# Patient Record
Sex: Female | Born: 1970 | Hispanic: No | Marital: Single | Smoking: Never smoker
Health system: Southern US, Community
[De-identification: ages and names within clinical notes are randomized; demographics above are authoritative.]

## PROBLEM LIST (undated history)

## (undated) DIAGNOSIS — R51 Headache: Secondary | ICD-10-CM

## (undated) DIAGNOSIS — R0602 Shortness of breath: Secondary | ICD-10-CM

## (undated) DIAGNOSIS — M722 Plantar fascial fibromatosis: Secondary | ICD-10-CM

## (undated) DIAGNOSIS — M7730 Calcaneal spur, unspecified foot: Secondary | ICD-10-CM

## (undated) DIAGNOSIS — Z9889 Other specified postprocedural states: Secondary | ICD-10-CM

## (undated) DIAGNOSIS — F419 Anxiety disorder, unspecified: Secondary | ICD-10-CM

## (undated) DIAGNOSIS — K219 Gastro-esophageal reflux disease without esophagitis: Secondary | ICD-10-CM

## (undated) DIAGNOSIS — R112 Nausea with vomiting, unspecified: Secondary | ICD-10-CM

## (undated) DIAGNOSIS — K760 Fatty (change of) liver, not elsewhere classified: Secondary | ICD-10-CM

## (undated) HISTORY — PX: ENDOMETRIAL ABLATION: SHX621

## (undated) HISTORY — PX: TUBAL LIGATION: SHX77

## (undated) HISTORY — PX: DILATION AND CURETTAGE OF UTERUS: SHX78

## (undated) HISTORY — PX: CARPAL TUNNEL RELEASE: SHX101

---

## 2003-06-18 ENCOUNTER — Encounter: Admission: RE | Admit: 2003-06-18 | Discharge: 2003-06-18 | Payer: Self-pay | Admitting: Obstetrics and Gynecology

## 2003-06-24 ENCOUNTER — Ambulatory Visit (HOSPITAL_COMMUNITY): Admission: RE | Admit: 2003-06-24 | Discharge: 2003-06-24 | Payer: Self-pay | Admitting: Family Medicine

## 2003-07-09 ENCOUNTER — Encounter: Admission: RE | Admit: 2003-07-09 | Discharge: 2003-07-09 | Payer: Self-pay | Admitting: Obstetrics and Gynecology

## 2008-05-11 ENCOUNTER — Encounter: Admission: RE | Admit: 2008-05-11 | Discharge: 2008-05-11 | Payer: Self-pay | Admitting: Sports Medicine

## 2008-06-11 ENCOUNTER — Encounter: Admission: RE | Admit: 2008-06-11 | Discharge: 2008-06-11 | Payer: Self-pay | Admitting: Sports Medicine

## 2010-01-14 ENCOUNTER — Encounter: Admission: RE | Admit: 2010-01-14 | Discharge: 2010-01-14 | Payer: Self-pay | Admitting: Sports Medicine

## 2010-01-17 ENCOUNTER — Encounter: Admission: RE | Admit: 2010-01-17 | Discharge: 2010-01-17 | Payer: Self-pay

## 2010-06-21 ENCOUNTER — Encounter: Admission: RE | Admit: 2010-06-21 | Discharge: 2010-06-21 | Payer: Self-pay | Admitting: Internal Medicine

## 2010-06-28 ENCOUNTER — Ambulatory Visit: Payer: Self-pay | Admitting: Nurse Practitioner

## 2010-07-21 ENCOUNTER — Encounter: Admission: RE | Admit: 2010-07-21 | Discharge: 2010-07-21 | Payer: Self-pay | Admitting: Obstetrics and Gynecology

## 2011-02-21 ENCOUNTER — Encounter (INDEPENDENT_AMBULATORY_CARE_PROVIDER_SITE_OTHER): Payer: 59 | Admitting: Nurse Practitioner

## 2011-02-21 DIAGNOSIS — G43109 Migraine with aura, not intractable, without status migrainosus: Secondary | ICD-10-CM

## 2011-02-22 NOTE — Assessment & Plan Note (Unsigned)
Pollard, Julia NO.:  [id number]  MEDICAL RECORD NO.:  [id number]           PATIENT TYPE:  LOCATION:  [facility]           FACILITY:  PHYSICIAN:  [name], MD          DATE OF BIRTH:  DATE OF SERVICE:  02/21/2011                                 CLINIC NOTE  The patient comes to office today for followup on her migraine headaches.  The patient was last seen on August 2011.  At that time, she was placed on 100 mg of Topamax given Imitrex and asked to return in 6 weeks.  The patient has had some personal difficulties including filing for bankruptcy and has not kept that return appointment.  She has called in for prescriptions and has been given refills and has maintained her Topamax at 100 mg.  She was doing well with the Topamax and Imitrex until recently when she had a severe headache that lasted for 3-4 days. When we described that headache, it was clear that she did not have her Imitrex and had not taken her Imitrex.  She did call her another physician and got a prescription for Ultram and Thorazine.  Those seem to have helped a little bit but not altogether.  She has continued to see Dr. Ferdinand Cava for her bleeding issues.  She did have I believe an ablation at one time, but continues to have a menstrual cycle.  He currently has her on Loestrin but which she does do well with, but she is an insulin-dependent diabetic and has obesity and fatty liver.  He has recommended that she may want to consider hysterectomy.  In the meantime, she has gained 10 pounds in the past 1 year and her weight is now up to 228 and her height is 5 feet 3 inches.  PHYSICAL EXAM:  VITAL SIGNS:  Blood pressure is 120/76, pulse 81, weight 228, height is 5 feet 3 inches. NEUROLOGIC:  The patient is alert.  She is oriented.  She has good mood and affect.  She has good muscle tone.  Good sensation.  Good coordination. EXTREMITIES:  Warm and dry.  ASSESSMENT: 1.  Migraine. 2. Obesity.  PLAN:  We had a lengthy discussion today and discussed the pathophysiology of migraine and how the Imitrex actually works to reverse the process of a migraine.  The patient has understanding of this and will keep her Imitrex with her now and use it when she has a headache.  We also discussed going up on the Topamax to 200 mg.  She did not have side effects of 100 mg.  Hopefully, we will go up to 200 that will give her better headache prevention and possibly help her with this weight.  She is also given the name of Dr. Baruch Merl for bariatric surgery evaluation. The patient will also get a refill on her Anaprox.  The patient is also asking for referral to see Dr. Nicholaus Bloom for robotic hysterectomy.  The patient will return to this office in 1 year or sooner as need be.     Julia Richter, NP   ______________________________ [name], MD   LR/MEDQ  D:  02/21/2011  T:  02/21/2011  Job:  [id number]

## 2011-03-08 ENCOUNTER — Other Ambulatory Visit: Payer: Self-pay | Admitting: Obstetrics & Gynecology

## 2011-03-08 ENCOUNTER — Ambulatory Visit (HOSPITAL_COMMUNITY)
Admission: RE | Admit: 2011-03-08 | Discharge: 2011-03-08 | Disposition: A | Discharge status: Home / Self Care | Payer: 59 | Source: Ambulatory Visit | Attending: Obstetrics & Gynecology | Admitting: Obstetrics & Gynecology

## 2011-03-08 ENCOUNTER — Ambulatory Visit (INDEPENDENT_AMBULATORY_CARE_PROVIDER_SITE_OTHER): Payer: 59 | Admitting: Obstetrics & Gynecology

## 2011-03-08 DIAGNOSIS — N949 Unspecified condition associated with female genital organs and menstrual cycle: Secondary | ICD-10-CM

## 2011-03-08 DIAGNOSIS — N925 Other specified irregular menstruation: Secondary | ICD-10-CM

## 2011-03-08 DIAGNOSIS — N938 Other specified abnormal uterine and vaginal bleeding: Secondary | ICD-10-CM | POA: Insufficient documentation

## 2011-03-08 DIAGNOSIS — N854 Malposition of uterus: Secondary | ICD-10-CM | POA: Insufficient documentation

## 2011-03-08 DIAGNOSIS — Z113 Encounter for screening for infections with a predominantly sexual mode of transmission: Secondary | ICD-10-CM

## 2011-03-08 DIAGNOSIS — Z1272 Encounter for screening for malignant neoplasm of vagina: Secondary | ICD-10-CM

## 2011-03-08 DIAGNOSIS — D252 Subserosal leiomyoma of uterus: Secondary | ICD-10-CM | POA: Insufficient documentation

## 2011-03-10 ENCOUNTER — Other Ambulatory Visit (HOSPITAL_COMMUNITY): Payer: 59

## 2011-03-10 ENCOUNTER — Ambulatory Visit (HOSPITAL_COMMUNITY): Payer: 59

## 2011-03-21 NOTE — Assessment & Plan Note (Signed)
NAMEREGENIA, ERCK     ACCOUNT NO.:  [id number]   MEDICAL RECORD NO.:  [id number]          PATIENT TYPE:  POB   LOCATION:  [facility]         FACILITY:  [facility]   PHYSICIAN:  Julia Richter, NP   DATE OF BIRTH:  1971/03/20   DATE OF SERVICE:                                  CLINIC NOTE   The patient comes office today for consultation on her migraine  headaches.  The patient has had migraine without aura since 2004.  Her  headaches are predominantly in her left temple and left occipital.  She  has [facility].  In 67-month  period, her headaches are approximately 5 to 6 severe, 6 to 6 moderate,  7 mild, and she feels that she has approximately 1 week with no  headaches.  Her headache certainly seem to be worse surrounding her  menstrual cycle.  She did have a tubal ligation some years back and does  seem to have a rather bad periods that her irregular.  She has talked to  her doctor, who was Dr. Windell Moulding in [facility] concerning using some method  to keep her from having a menstrual cycle.  [name] is also an insulin-  dependent diabetic since 1998.  She is currently on NovoLog, Lantus, and  checks her sugar faithfully.  She was recently diagnosed with elevated  liver enzymes and fatty livers.  She has had ultrasounds and this is  being watched.  From the standpoint of her headaches, she has been off  of her Topamax for approximately 1 month; with that being off, she has  gained 10-20 pounds and feels that her headaches are worse.  She needs  to have her medications refilled today.   PERSONAL HISTORY:  Diabetes.   ALLERGIES:  None.   LAST MENSTRUAL PERIOD:  June 16, 2010.   OBSTETRICAL HISTORY:  P3, G2, 1 miscarriage, 2 C-sections.   SOCIAL HISTORY:  The patient works as an Equities trader.  She is living  with her boyfriend and 2 children.   SURGICAL HISTORY:  She has had 2 C sections in 2002, 2009, 2001 fibroid  removal,  2008 repair of right carpal tunnel.   FAMILY HISTORY:  Diabetes, heart disease.  No cancer.   REVIEW OF SYSTEMS:  Positive for weight gain, depression, anxiety  headache, dizziness, shortness of breath, palpitations, leg cramps,  swelling, muscle joint, abdominal pain, vaginal discharge, vaginal  itching, pain with intercourse.   PHYSICAL EXAMINATION:  VITAL SIGNS:  Blood pressure is 112/69, pulse is  80, weight is 217, height is 5 feet 3 inches.  GENERAL:  Well-developed, well-nourished, overweight 40 year old  Hispanic female in no acute distress.  HEENT:  Head is normocephalic, atraumatic.  Pupils equal and reactive.  LUNGS:  Clear bilaterally.  CARDIAC:  Regular rate and rhythm.  EXTREMITIES:  Warm and dry.   ASSESSMENT:  1. Migraine without aura.  2. Insulin-dependent diabetes.   PLAN:  We had a lengthy discussion today and have decided to go back on  her Topamax.  She will be given a prescription for 100 mg.  She will  take half a tablet for 1-2 weeks to buildup.  She is given #30 with  2  refills.  She will also begin to reuse Imitrex, which she has not been  using recently at 100 mg p.o. p.r.n., #9 with p.r.n. refills.  She will  also use Anaprox DS one p.o. b.i.d., #60 with 1 refill.  She is given  Norflex to use for her milder headaches 100 mg one p.o. b.i.d., #60 with  1 refill.  For rescue, she uses Thorazine 25 mg one p.o. p.r.n. severe  migraine, #30 with no refills.  The patient will discuss her menstrual  cycle with Dr. Windell Moulding and return to this clinic in 6 weeks or sooner as  need be.      Julia Richter, NP     LR/MEDQ  D:  06/28/2010  T:  06/29/2010  Job:  [id number]

## 2011-03-24 NOTE — Group Therapy Note (Signed)
[name], LIST NO.:  [id number]   MEDICAL RECORD NO.:  [id number]                   PATIENT TYPE:  OUT   LOCATION:  [facility]                           FACILITY:  [facility]   PHYSICIAN:  Tinnie Gens, MD                     DATE OF BIRTH:  Feb 17, 1971   DATE OF SERVICE:  06/18/2003                                    CLINIC NOTE   CHIEF COMPLAINT:  Heavy vaginal bleeding.   HISTORY OF PRESENT ILLNESS:  The patient is a 40 year old gravida 1 para 1  Hispanic female who has a history of fibroids and is status post myomectomy  in the past.  She reports that she has been on Depo since the birth of her  last child in 2002 and had been doing fine.  Approximately six weeks ago she  developed heavy bleeding with cramping that has been unresponsive to Ortho-  Cyclen.  She has been on twice-a-day Ortho-Cyclen pills and her bleeding has  yet to stop, although it has lessened.  She was told that she had nine  fibroid tumors some time ago and had myomectomy that removed six tumors but  knows that she has three left.   PAST MEDICAL HISTORY:  Significant for diabetes mellitus and migraine  headaches.   PAST SURGICAL HISTORY:  She had a C-section in 2002 and a myomectomy in  2001.   MEDICATIONS:  Glucovance, ibuprofen, tramadol, Topamax, Ortho-Cyclen, and  some medicine that starts with an A for kidney protection - which I am  assuming is Altace.   PAST OBSTETRICAL HISTORY:  She is a G1 P1.  She had a C-section at 26 weeks  of a 2 pound infant with preterm labor, significant dilation, had C-section  for cord prolapse.   PAST GYNECOLOGICAL HISTORY:  Her last Pap was in August 2003 and was normal.  Her menarche was at age 31 and her cycles have never been real regular.  She  is currently on Depo-Provera and oral contraceptives.   FAMILY HISTORY:  Significant for diabetes, coronary artery disease, and high  blood pressure.   SOCIAL HISTORY:  She lives alone  with her child.  Her boyfriend is in  [place].  She is a Architectural technologist.  She denies alcohol, tobacco, or drug  use.   REVIEW OF SYSTEMS:  Significant for numbness and weakness in her fingers,  swelling in her legs, muscles aches, fatigue, weight gain, frequent  headaches, dizzy spells, problems with breathing and shortness of breath,  loss of urine with coughing, hot flashes sometimes, vaginal odor sometimes,  vaginal itching sometimes, and pain with intercourse.   PHYSICAL EXAMINATION:  VITAL SIGNS:  Temperature 99.1, pulse 85, blood  pressure 106/68, weight is 194.4.  GENERAL:  She is a well-developed, well-nourished Hispanic female in no  acute distress.  LUNGS:  Clear bilaterally.  CARDIOVASCULAR:  Regular rate and rhythm.  No rubs, gallops, or murmurs.  ABDOMEN:  Soft,  nontender, nondistended.  GENITOURINARY:  Normal external female genitalia.  The vagina is rugated.  There is a moderate amount of bleeding noted from the cervical os which is  nulliparous in appearance.  There is no lesion there.  The uterus is  palpated to be approximately 12 weeks size and is fairly nontender.   IMPRESSION:  1. Menorrhagia, known fibroids; also, has been on Depo for a long time.  Any     combination of these two could be contributing to her bleeding today.  At     40 years old and being on Depo for such a long time it is unlikely that     she would have any endometrial carcinoma.  She may have a thyroid     problem.   PLAN:  1. She will get her Pap smear up at the health department in [place]     next week.  2. Pelvic ultrasound for placement and size of her fibroids.  3. Continue her oral contraceptives.  Discuss possibly discontinuing Depo-     Provera.  4. Discuss option of possibly removing fibroids if these continue to be a     problem.  5. Consider TSH check next visit.   The patient does desire future fertility and this will be an issue in the  treatment plans.                                                Tinnie Gens, MD    TP/MEDQ  D:  06/18/2003  T:  06/18/2003  Job:  [id number]

## 2011-03-24 NOTE — Group Therapy Note (Signed)
   [name], [name] NO.:  [id number]   MEDICAL RECORD NO.:  [id number]                   PATIENT TYPE:  OUT   LOCATION[facility]  [facility]                           FACILITY:  [facility]   PHYSICIAN:  Tinnie Gens, MD                     DATE OF BIRTH:  Sep 09, 1971   DATE OF SERVICE:  07/09/2003                                    CLINIC NOTE   CHIEF COMPLAINT:  Abnormal vaginal bleeding.   HISTORY OF PRESENT ILLNESS:  The patient is a 40 year old G 1, P 1 who has  history of fibroids status post myomectomy and C-section with her last  pregnancy who comes in with bleeding.  It was not clear exactly the etiology  of her bleeding.  However, a pelvic ultrasound failed to reveal fibroids.  She had been on Depo-Provera and she stopped that and she was then taking  Ortho-Cyclen.  Her bleeding has diminished somewhat but she is still  spotting.  Her pathology also revealed a normal-appearing endometrial  stripe.  We did not check any labs at her last office visit.   PHYSICAL EXAMINATION:  ABDOMEN:  Soft, nontender, nondistended.  NECK:  Supple.  There is no thyromegaly or thyroid nodule.   IMPRESSION:  Menometrorrhagia likely related to Depo-Provera withdrawal  versus questionable adenomyosis versus other.   PLAN:  Check TSH today, continue Ortho-Cyclen, discontinue Depo-Provera.  Will follow up in two months.                                               Tinnie Gens, MD    TP/MEDQ  D:  07/09/2003  T:  07/09/2003  Job:  [id number]

## 2011-03-28 ENCOUNTER — Ambulatory Visit: Payer: 59 | Admitting: Obstetrics & Gynecology

## 2011-04-04 ENCOUNTER — Ambulatory Visit (INDEPENDENT_AMBULATORY_CARE_PROVIDER_SITE_OTHER): Payer: 59 | Admitting: Obstetrics & Gynecology

## 2011-04-04 DIAGNOSIS — IMO0002 Reserved for concepts with insufficient information to code with codable children: Secondary | ICD-10-CM

## 2011-04-04 DIAGNOSIS — N949 Unspecified condition associated with female genital organs and menstrual cycle: Secondary | ICD-10-CM

## 2011-04-05 NOTE — Assessment & Plan Note (Signed)
[name], Julia NO.:  [id number]  MEDICAL RECORD NO.:  [id number]           PATIENT TYPE:  LOCATION:  [facility]           FACILITY:  PHYSICIAN:  Allie Bossier, MD        DATE OF BIRTH:  Jul 30, 1971  DATE OF SERVICE:  04/03/2011                                 CLINIC NOTE  HISTORY OF PRESENT ILLNESS:  [name] is a 40 year old single Hispanic gravida 3, para 2, abortus 1 who I saw on Mar 08, 2011 because the patient complained of dysfunctional uterine bleeding and chronic pelvic pain as well as dyspareunia.  She has came again telling me that she would like a hysterectomy.  I explained to her that we need to have indication for hysterectomy and I began a workup because her Pap smear had not been done in long time, I did that and it came back as negative. I did an ultrasound which showed uterus that measures 12.2 x 5.7 x 6.2 cm.  The endometrium was normal at 11 mm.  There were 2 small fibroids measuring 1.6 cm.  There was no submucosal component.  Her ovaries appeared normal and her cervical cultures were negative.  Her hemoglobin was 13 and her TSH was 2.45.  The only abnormality of note was her white count was elevated at 12, but she has no particular sites of obvious infection.  Please note that in the past, she had Mirena IUD but it had a self-expulsion.  She has since been on birth control pills (Loestrin), She says that while she is on Loestrin her periods are relatively light, sometimes she only has spotting in the month.  However, her chronic pelvic pain is not better with the pill.  She describes she places her hand just over her bladder when she is describing where it hurts.  Her other complaint is that of significant dyspareunia and this has been going on since she began having intercourse.  Please note that in the past, her previous OB/GYN attempted an ablation, however, he told her that her cervix was stenotic and he was unable to complete  the procedure.  ASSESSMENT/PLAN: 1. The patient perceived dysfunctional uterine bleeding.  Maudy does     say that her bleeding irregularities are relieved by birth control     pills.  However, she said she does not want to take birth control     pills and definitely 2. Chronic pelvic pain.  I have offered to do a laparoscopy I     explained that when the patient has symptoms such as she does, I     would expect approximately two thirds of the time to find some     cause pain, probably endometriosis, but that in one-third of the     case, the pain is still unexplained in that case, I would recommend     that she see a urologist for workup of possible interstitial     cystitis.  She understands the risks of surgery including but not     limited to infection, bleeding, damage to bowel, bladder, and     ureters.  She wishes to proceed.  This will be scheduled, and I     will see her back  when she is 6 weeks postop.     Allie Bossier, MD    MCD/MEDQ  D:  04/04/2011  T:  04/05/2011  Job:  [id number]

## 2011-04-24 ENCOUNTER — Other Ambulatory Visit (HOSPITAL_COMMUNITY): Payer: 59

## 2011-05-11 ENCOUNTER — Encounter (HOSPITAL_COMMUNITY): Payer: Self-pay

## 2011-05-11 ENCOUNTER — Encounter (HOSPITAL_COMMUNITY)
Admission: RE | Admit: 2011-05-11 | Discharge: 2011-05-11 | Disposition: A | Discharge status: Home / Self Care | Payer: 59 | Source: Ambulatory Visit | Attending: Obstetrics & Gynecology | Admitting: Obstetrics & Gynecology

## 2011-05-11 HISTORY — DX: Shortness of breath: R06.02

## 2011-05-11 HISTORY — DX: Headache: R51

## 2011-05-11 HISTORY — DX: Calcaneal spur, unspecified foot: M77.30

## 2011-05-11 HISTORY — DX: Gastro-esophageal reflux disease without esophagitis: K21.9

## 2011-05-11 HISTORY — DX: Anxiety disorder, unspecified: F41.9

## 2011-05-11 HISTORY — DX: Plantar fascial fibromatosis: M72.2

## 2011-05-11 HISTORY — DX: Other specified postprocedural states: R11.2

## 2011-05-11 HISTORY — DX: Other specified postprocedural states: Z98.890

## 2011-05-11 HISTORY — DX: Fatty (change of) liver, not elsewhere classified: K76.0

## 2011-05-11 LAB — CBC
HCT: 37.3 % (ref 36.0–46.0)
MCH: 29.8 pg (ref 26.0–34.0)
MCHC: 34.6 g/dL (ref 30.0–36.0)
MCV: 86.1 fL (ref 78.0–100.0)
RDW: 13.2 % (ref 11.5–15.5)

## 2011-05-11 LAB — BASIC METABOLIC PANEL
BUN: 10 mg/dL (ref 6–23)
Creatinine, Ser: <0.47 mg/dL — ABNORMAL LOW (ref 0.50–1.10)

## 2011-05-11 NOTE — Patient Instructions (Signed)
Written inst. Given to pt

## 2011-05-15 ENCOUNTER — Encounter (HOSPITAL_COMMUNITY)
Admission: RE | Disposition: A | Discharge status: Home / Self Care | Payer: Self-pay | Source: Ambulatory Visit | Attending: Obstetrics & Gynecology

## 2011-05-15 ENCOUNTER — Ambulatory Visit (HOSPITAL_COMMUNITY)
Admission: RE | Admit: 2011-05-15 | Discharge: 2011-05-15 | Disposition: A | Discharge status: Home / Self Care | Payer: 59 | Source: Ambulatory Visit | Attending: Obstetrics & Gynecology | Admitting: Obstetrics & Gynecology

## 2011-05-15 ENCOUNTER — Encounter (HOSPITAL_COMMUNITY): Payer: Self-pay | Admitting: Certified Registered Nurse Anesthetist

## 2011-05-15 ENCOUNTER — Ambulatory Visit (HOSPITAL_COMMUNITY): Payer: 59 | Admitting: Certified Registered Nurse Anesthetist

## 2011-05-15 DIAGNOSIS — N949 Unspecified condition associated with female genital organs and menstrual cycle: Secondary | ICD-10-CM

## 2011-05-15 DIAGNOSIS — Z01818 Encounter for other preprocedural examination: Secondary | ICD-10-CM

## 2011-05-15 DIAGNOSIS — Z01812 Encounter for preprocedural laboratory examination: Secondary | ICD-10-CM

## 2011-05-15 HISTORY — PX: LAPAROSCOPY: SHX197

## 2011-05-15 SURGERY — LAPAROSCOPY OPERATIVE
Anesthesia: General | Site: Abdomen | Wound class: Clean Contaminated

## 2011-05-15 MED ORDER — MEPERIDINE HCL 25 MG/ML IJ SOLN
6.2500 mg | INTRAMUSCULAR | Status: DC | PRN
Start: 2011-05-15 — End: 2011-05-15
  Administered 2011-05-15 (×2): 12.5 mg via INTRAVENOUS

## 2011-05-15 MED ORDER — LACTATED RINGERS IV SOLN
INTRAVENOUS | Status: DC | PRN
  Administered 2011-05-15 (×2): via INTRAVENOUS

## 2011-05-15 MED ORDER — PANTOPRAZOLE SODIUM 40 MG PO TBEC
DELAYED_RELEASE_TABLET | ORAL | Status: AC
  Administered 2011-05-15: 40 mg
  Filled 2011-05-15: qty 1

## 2011-05-15 MED ORDER — FENTANYL CITRATE 0.05 MG/ML IJ SOLN
INTRAMUSCULAR | Status: DC | PRN
  Administered 2011-05-15 (×5): 50 ug via INTRAVENOUS

## 2011-05-15 MED ORDER — ACETAMINOPHEN 325 MG PO TABS: 325.0000 mg | ORAL_TABLET | ORAL | Status: DC | PRN

## 2011-05-15 MED ORDER — NEOSTIGMINE METHYLSULFATE 1 MG/ML IJ SOLN
INTRAMUSCULAR | Status: DC | PRN
  Administered 2011-05-15: 5 mg via INTRAVENOUS

## 2011-05-15 MED ORDER — MIDAZOLAM HCL 5 MG/5ML IJ SOLN
INTRAMUSCULAR | Status: DC | PRN
  Administered 2011-05-15: 2 mg via INTRAVENOUS

## 2011-05-15 MED ORDER — KETOROLAC TROMETHAMINE 30 MG/ML IJ SOLN
INTRAMUSCULAR | Status: DC | PRN
  Administered 2011-05-15: 30 mg via INTRAVENOUS

## 2011-05-15 MED ORDER — FENTANYL CITRATE 0.05 MG/ML IJ SOLN
50.0000 ug | Freq: Once | INTRAMUSCULAR | Status: AC
  Administered 2011-05-15: 50 ug via INTRAVENOUS

## 2011-05-15 MED ORDER — LIDOCAINE HCL (CARDIAC) 10 MG/ML IV SOLN
INTRAVENOUS | Status: DC | PRN
  Administered 2011-05-15 (×2): 30 mg via INTRAVENOUS

## 2011-05-15 MED ORDER — METOCLOPRAMIDE HCL 5 MG/ML IJ SOLN
10.0000 mg | Freq: Once | INTRAMUSCULAR | Status: AC
  Administered 2011-05-15: 10 mg via INTRAVENOUS

## 2011-05-15 MED ORDER — CEFAZOLIN SODIUM 1-5 GM-% IV SOLN
INTRAVENOUS | Status: AC
  Filled 2011-05-15: qty 50

## 2011-05-15 MED ORDER — PROMETHAZINE HCL 25 MG/ML IJ SOLN: 6.2500 mg | INTRAMUSCULAR | Status: DC | PRN

## 2011-05-15 MED ORDER — HYDROMORPHONE HCL 1 MG/ML IJ SOLN
INTRAMUSCULAR | Status: DC | PRN
  Administered 2011-05-15: 1 mg via INTRAVENOUS

## 2011-05-15 MED ORDER — ROCURONIUM BROMIDE 100 MG/10ML IV SOLN
INTRAVENOUS | Status: DC | PRN
  Administered 2011-05-15: 40 mg via INTRAVENOUS

## 2011-05-15 MED ORDER — HYDROMORPHONE HCL 1 MG/ML IJ SOLN
0.2500 mg | INTRAMUSCULAR | Status: DC | PRN
  Administered 2011-05-15 (×2): 0.5 mg via INTRAVENOUS

## 2011-05-15 MED ORDER — PANTOPRAZOLE SODIUM 40 MG PO TBEC: 40.0000 mg | DELAYED_RELEASE_TABLET | Freq: Once | ORAL | Status: DC

## 2011-05-15 MED ORDER — CEFAZOLIN SODIUM 1-5 GM-% IV SOLN: 1.0000 g | INTRAVENOUS | Status: DC

## 2011-05-15 MED ORDER — CEFAZOLIN SODIUM 1-5 GM-% IV SOLN
INTRAVENOUS | Status: DC | PRN
  Administered 2011-05-15: 1 g via INTRAVENOUS

## 2011-05-15 MED ORDER — LACTATED RINGERS IV SOLN
INTRAVENOUS | Status: DC
  Administered 2011-05-15: 14:00:00 via INTRAVENOUS

## 2011-05-15 MED ORDER — PROPOFOL 10 MG/ML IV EMUL
INTRAVENOUS | Status: DC | PRN
  Administered 2011-05-15: 200 mg via INTRAVENOUS

## 2011-05-15 MED ORDER — ONDANSETRON HCL 4 MG/2ML IJ SOLN
INTRAMUSCULAR | Status: DC | PRN
  Administered 2011-05-15 (×2): 4 mg via INTRAMUSCULAR

## 2011-05-15 MED ORDER — BUPIVACAINE HCL (PF) 0.5 % IJ SOLN
INTRAMUSCULAR | Status: DC | PRN
  Administered 2011-05-15: 20 mL

## 2011-05-15 MED ORDER — ACETAMINOPHEN 10 MG/ML IV SOLN: 1000.0000 mg | Freq: Once | INTRAVENOUS | Status: DC | PRN

## 2011-05-15 MED ORDER — GLYCOPYRROLATE 0.2 MG/ML IJ SOLN
INTRAMUSCULAR | Status: DC | PRN
  Administered 2011-05-15: .8 mg via INTRAVENOUS

## 2011-05-15 SURGICAL SUPPLY — 31 items
APPLICATOR COTTON TIP 6IN STRL (MISCELLANEOUS) IMPLANT
BARRIER ADHS 3X4 INTERCEED (GAUZE/BANDAGES/DRESSINGS) ×2 IMPLANT
BENZOIN TINCTURE PRP APPL 2/3 (GAUZE/BANDAGES/DRESSINGS) IMPLANT
CABLE HIGH FREQUENCY MONO STRZ (ELECTRODE) ×2 IMPLANT
CATH ROBINSON RED A/P 16FR (CATHETERS) ×2 IMPLANT
CHLORAPREP W/TINT 26ML (MISCELLANEOUS) ×2 IMPLANT
CLOTH BEACON ORANGE TIMEOUT ST (SAFETY) ×2 IMPLANT
DERMABOND ADVANCED (GAUZE/BANDAGES/DRESSINGS) ×2 IMPLANT
DRAPE UTILITY XL STRL (DRAPES) IMPLANT
ELECT REM PT RETURN 9FT ADLT (ELECTROSURGICAL)
ELECTRODE REM PT RTRN 9FT ADLT (ELECTROSURGICAL) IMPLANT
FORCEPS CUTTING 33CM 5MM (CUTTING FORCEPS) IMPLANT
FORCEPS CUTTING 45CM 5MM (CUTTING FORCEPS) IMPLANT
GLOVE BIO SURGEON STRL SZ 6.5 (GLOVE) ×4 IMPLANT
GLOVE SURG SS PI 6.5 STRL IVOR (GLOVE) ×2 IMPLANT
GOWN BRE IMP SLV AUR LG STRL (GOWN DISPOSABLE) ×6 IMPLANT
NDL SAFETY ECLIPSE 18X1.5 (NEEDLE) ×1 IMPLANT
NEEDLE HYPO 18GX1.5 SHARP (NEEDLE) ×1
NEEDLE INSUFFLATION 14GA 120MM (NEEDLE) ×2 IMPLANT
NS IRRIG 1000ML POUR BTL (IV SOLUTION) ×2 IMPLANT
PACK LAPAROSCOPY BASIN (CUSTOM PROCEDURE TRAY) ×2 IMPLANT
POUCH SPECIMEN RETRIEVAL 10MM (ENDOMECHANICALS) IMPLANT
SET IRRIG TUBING LAPAROSCOPIC (IRRIGATION / IRRIGATOR) IMPLANT
STRIP CLOSURE SKIN 1/4X4 (GAUZE/BANDAGES/DRESSINGS) IMPLANT
SUT VICRYL 0 ENDOLOOP (SUTURE) IMPLANT
SUT VICRYL 0 UR6 27IN ABS (SUTURE) ×2 IMPLANT
SUT VICRYL 4-0 PS2 18IN ABS (SUTURE) ×2 IMPLANT
TOWEL OR 17X24 6PK STRL BLUE (TOWEL DISPOSABLE) ×4 IMPLANT
TROCAR XCEL NON-BLD 11X100MML (ENDOMECHANICALS) ×2 IMPLANT
TROCAR XCEL NON-BLD 5MMX100MML (ENDOMECHANICALS) ×2 IMPLANT
WATER STERILE IRR 1000ML POUR (IV SOLUTION) ×2 IMPLANT

## 2011-05-15 NOTE — Anesthesia Postprocedure Evaluation (Signed)
  Anesthesia Post-op Note  Patient: [name]  Procedure(s) Performed:  LAPAROSCOPY OPERATIVE - Lysis of Adhesions  Patient's cardiopulmonary status is stable Patient's level of consciousness: sedate but responsive verbally Pain and nausea are all reasonably controlled No anesthetic complications apparent at this time No follow up care necessary at this time

## 2011-05-15 NOTE — Anesthesia Preprocedure Evaluation (Signed)
Anesthesia Evaluation  Name, MR# and DOB Patient awake  General Assessment Comment  Reviewed: Allergy & Precautions, H&P , Patient's Chart, lab work & pertinent test results and reviewed documented beta blocker date and time   History of Anesthesia Complications (+) PONV  Airway Mallampati: II and III TM Distance: >3 FB Neck ROM: full    Dental No notable dental hx    Pulmonaryneg pulmonary ROS (+) shortness of breath   clear to auscultation  pulmonary exam normal   Cardiovascular Exercise Tolerance: Good regular Normal   Neuro/PsychNegative Neurological ROS Negative Psych ROS  GI/Hepatic/Renal negative GI ROS, negative Liver ROS, and negative Renal ROS (+)  GERD      Endo/Other  Negative Endocrine ROS (+) Diabetes mellitus- Morbid obesity Abdominal   Musculoskeletal  Hematology negative hematology ROS (+)   Peds  Reproductive/Obstetrics negative OB ROS          Anesthesia Physical Anesthesia Plan  ASA: III  Anesthesia Plan: General   Post-op Pain Management:    Induction:   Airway Management Planned:   Additional Equipment:   Intra-op Plan:   Post-operative Plan:   Informed Consent: I have reviewed the patients History and Physical, chart, labs and discussed the procedure including the risks, benefits and alternatives for the proposed anesthesia with the patient or authorized representative who has indicated his/her understanding and acceptance.   Dental Advisory Given  Plan Discussed with: CRNA and Surgeon  Anesthesia Plan Comments:         Anesthesia Quick Evaluation

## 2011-05-15 NOTE — Transfer of Care (Signed)
Immediate Anesthesia Transfer of Care Note  Patient: [name]  Procedure(s) Performed:  LAPAROSCOPY OPERATIVE - Lysis of Adhesions  Patient Location: PACU  Anesthesia Type: General  Level of Consciousness: awake, patient cooperative and responds to stimulation  Airway & Oxygen Therapy: Patient Spontanous Breathing and Patient connected to nasal cannula oxygen  Post-op Assessment: Report given to PACU RN, Post -op Vital signs reviewed and stable and Patient moving all extremities  Post vital signs: Reviewed and stable2  Complications: No apparent anesthesia complications

## 2011-05-15 NOTE — Addendum Note (Signed)
Addendum  created 05/15/11 2031 by Velna Hatchet   Modules edited:Notes Section

## 2011-05-15 NOTE — Anesthesia Postprocedure Evaluation (Signed)
  Anesthesia Post-op Note  Patient: [name]  Procedure(s) Performed:  LAPAROSCOPY OPERATIVE - Lysis of Adhesions  Patient's cardiopulmonary status is stable Patient's level of consciousness: sedate but responsive verbally Pain and nausea are all reasonably controlled No anesthetic complications apparent at this time No follow up care necessary at this time  

## 2011-05-16 NOTE — Op Note (Signed)
NAMECHRISTYANN, MANOLIS     ACCOUNT NO.:  [id number]  MEDICAL RECORD NO.:  [id number]  LOCATION:  [facility]                          [facility]  PHYSICIAN:  Allie Bossier, MD        DATE OF BIRTH:  1970/11/26  DATE OF PROCEDURE:  05/15/2011 DATE OF DISCHARGE:  05/15/2011                              OPERATIVE REPORT   PREOPERATIVE DIAGNOSIS:  Chronic pelvic pain.  POSTOPERATIVE DIAGNOSIS:  Chronic pelvic pain plus dense adhesions.  PROCEDURE:  Diagnostic laparoscopy, lysis of adhesions.  SURGEON:  Allie Bossier, MD  ANESTHESIA:  Brayton Caves, MD, GETA.  COMPLICATIONS:  None.  ESTIMATED BLOOD LOSS:  Minimal.  SPECIMENS:  None.  FINDINGS:  Dense adhesions of the omentum to the uterus, dense adhesions of the uterus to the anterior abdominal wall.  No endometriosis visualized.  DETAILED PROCEDURE AND FINDINGS:  The risks, benefits, and alternatives of surgery were explained, understood and accepted.  Consents were signed.  She was taken to the operating room, general anesthesia was applied without complication.  She was placed in dorsal lithotomy position.  Her abdomen and vagina were prepped and draped in usual sterile fashion.  Bladder was emptied with a Robinson catheter. Bimanual exam revealed an uterus at the upper limits of normal for size and nonenlarged adnexa.  A Hulka manipulator was placed.  Gloves were changed.  Attention was turned to the abdomen; 15 mL of 0.5% Marcaine were injected in the umbilical site.  A vertical umbilical incision was made.  A Veress needle was placed intraperitoneally.  Low-flow CO2 was used to insufflate the abdomen at approximately 5 L.  A good pneumoperitoneum was established.  We used towel clips to elevator her abdomen and I inserted a 10-mm Xcel trocar under direct laparoscopic visualization.  She was placed then in a Trendelenburg position.  The patient abdominal pressure was always lower than 15 and the pelvis  was inspected.  The immediate finding was complete obliteration of the pelvis because of omental adhesions.  I was able to visualize a site on the patient's left lower quadrant where I placed a 5-mm port.  Then using a cautery and laparoscopic scissors was able to take down the omental adhesions enough to where I was able to visualize the uterus. At this point the uterus was obviously densely adherent to the anterior abdominal wall.  I made an initial attempt to separate the adhesions of the uterus to the anterior abdominal wall.  However I did not feel that with the limited visualization, I had this as was safe.  At this point I noted that all edges of the omentum that I had cut were hemostatic.  I remove the 5 mm port.  No bleeding was noted from the site.  I allowed the CO2 to escape from the abdomen, then removed the umbilical port.  The incisions were closed with Dermabond.  The Hulka manipulator was removed.  She was taken to recovery room in stable condition.  Instrument, sponge and needle counts were correct.     Allie Bossier, MD     MCD/MEDQ  D:  05/15/2011  T:  05/16/2011  Job:  [id number]

## 2011-05-19 NOTE — Assessment & Plan Note (Signed)
[name], FURNISS NO.:  [id number]  MEDICAL RECORD NO.:  [id number]           PATIENT TYPE:  O  LOCATION:  [facility]          [facility]  PHYSICIAN:  Allie Bossier, MD        DATE OF BIRTH:  1971/04/27  DATE OF SERVICE:  03/08/2011                                 CLINIC NOTE  HISTORY OF PRESENT ILLNESS:  [name] is a 40 year old single Hispanic gravida 3, para 2, abortus 1.  She has 2 and 29-year-old children.  She normally sees [name] for her migraines and she comes today as a referral from [name] because of her desire for hysterectomy.  She tells me that she had menarche at age 56 and has had "irregular periods" for years.  She will skip several months and then bleed very heavily for the next 2 cycles or so.  She says that when she has her periods, they are very painful and she has to wear 3-4 pads at a time.  She says that she had dyspareunia since she began having intercourse.  PAST MEDICAL HISTORY:  Significant for obesity, migraine, and insulin- dependent diabetes.  Please note that she started off as gestational diabetes and then proceeded to insulin-dependent a year after her delivery.  PAST SURGICAL HISTORY:  She had 2 C-sections, cerclage or tubal.  ALLERGIES:  No known drug allergies.  No latex allergies.  MEDICATIONS:  Loestrin, tramadol, Imitrex, multiple vitamins, Topamax, baclofen, and insulin (NovoLog and Lantus).  Please note that her previous OB/GYN attempted an endometrial ablation, however, he explained to her that her cervix was stenotic and he was unable to complete the procedure.  REVIEW OF SYSTEMS:  Her Pap smear was last done in 2010.  She is single but monogamous.  PHYSICAL EXAMINATION:  EXTERNAL GENITALIA:  Normal.  Her vaginal vault is very long and her cervix is nulliparous with no discharges.  Her bimanual exam reveals a upper her limit of normal anteverted uterus.  It is limited in its  mobility.  Her adnexa are nontender and nonenlarged.  ASSESSMENT AND PLAN:  Dysfunctional uterine bleeding in a obese diabetic lady.  I am starting a workup for this including cervical cultures, CBC, Pap smear, and an ultrasound.  She will return to this office when these results are all available.     Allie Bossier, MD    MCD/MEDQ  D:  03/08/2011  T:  03/08/2011  Job:  [id number]

## 2011-05-22 ENCOUNTER — Telehealth: Payer: Self-pay

## 2011-05-22 NOTE — Telephone Encounter (Signed)
Patient wants to speak to you about x-rays and her results she spoke to you this morning about it already please call her. Thanks!

## 2011-05-23 NOTE — Telephone Encounter (Signed)
[name] is having increased lower abdominal pain post her laparoscopy last week.  She went to her primary care last Friday as we were not open.  They did an xray of her abdomen and it showed constipation.  I advised patient to try magnesium citrate to relieve her constipation.  She has been using metamucil without results.  She will call us back if this does not relieve the pain and we will have her seen Thursday.

## 2011-05-24 ENCOUNTER — Telehealth: Payer: Self-pay

## 2011-05-24 NOTE — Telephone Encounter (Signed)
Please call patient [name] is still in pain and constipated, [name] said [name] might need to come in and see Dr. Marice Potter please call her back. Thanks!

## 2011-05-25 ENCOUNTER — Other Ambulatory Visit: Payer: Self-pay | Admitting: *Deleted

## 2011-05-25 MED ORDER — MAGNESIUM CITRATE PO SOLN: 296.0000 mL | Freq: Once | ORAL | Status: DC

## 2011-05-25 NOTE — Telephone Encounter (Signed)
Spoke to [employer] and she has had a small amount of bowel movement but feels still crampy.  Just a small amount of soreness at her laparoscope site.  She is going to try magnesium citrate and she will call back with results of this.  She had an xray at her primary care that showed constipation.

## 2011-05-25 NOTE — Telephone Encounter (Signed)
Called Rx into [facility] @ [phone].

## 2011-05-30 ENCOUNTER — Encounter (HOSPITAL_COMMUNITY): Payer: Self-pay | Admitting: Obstetrics & Gynecology

## 2011-06-26 ENCOUNTER — Ambulatory Visit (INDEPENDENT_AMBULATORY_CARE_PROVIDER_SITE_OTHER): Payer: 59 | Admitting: Obstetrics & Gynecology

## 2011-06-26 ENCOUNTER — Encounter: Payer: Self-pay | Admitting: Obstetrics & Gynecology

## 2011-06-26 VITALS — BP 113/68 | HR 84 | Ht 63.0 in | Wt 230.0 lb

## 2011-06-26 DIAGNOSIS — Z9889 Other specified postprocedural states: Secondary | ICD-10-CM

## 2011-06-26 NOTE — Progress Notes (Signed)
  Subjective:    Patient ID: [name], female    DOB: 12-16-1970, 40 y.o.   MRN: [id number]  HPI  [name] is now 5 weeks post op status post diagnostic laparoscopy and LOA.  She has no post op problems except for a vulvar "pimple" on her left labia majora as well as a hemorrhoid for which she has an appt scheduled with a general surgeon. She has been back at work for several weeks and reports no bowel or bladder issues currently.  She says her chronic pelvic pain is still present.  Review of Systems  Pap smear due in May 2013 Objective:   Physical Exam   Pimple on left labia noted and popped Incisions on abdomen healed well     Assessment & Plan:  Post op- stable Chronic pelvic pain- no endometriosis seen on laparoscopy but EXTENSIVE adhesions noted.  I told her I will schedule a TAH for her at her convenience.

## 2011-07-26 ENCOUNTER — Telehealth: Payer: Self-pay

## 2011-07-26 NOTE — Telephone Encounter (Signed)
Per TD- any physician here can see worker's comp, but NO physician here will see IME cases. Need to find out from [place] what exactly is needed here. LMTCB.

## 2011-07-27 NOTE — Telephone Encounter (Signed)
lmomtcb  

## 2011-07-28 NOTE — Telephone Encounter (Signed)
LMTCB

## 2011-07-31 ENCOUNTER — Telehealth: Payer: Self-pay

## 2011-07-31 NOTE — Telephone Encounter (Signed)
LMTCB x 3 and will sign per protocol

## 2011-07-31 NOTE — Telephone Encounter (Signed)
LMTCB- see last phone note

## 2011-08-01 ENCOUNTER — Ambulatory Visit (INDEPENDENT_AMBULATORY_CARE_PROVIDER_SITE_OTHER): Payer: 59 | Admitting: Nurse Practitioner

## 2011-08-01 ENCOUNTER — Encounter: Payer: Self-pay | Admitting: Nurse Practitioner

## 2011-08-01 DIAGNOSIS — IMO0001 Reserved for inherently not codable concepts without codable children: Secondary | ICD-10-CM | POA: Insufficient documentation

## 2011-08-01 DIAGNOSIS — E119 Type 2 diabetes mellitus without complications: Secondary | ICD-10-CM

## 2011-08-01 DIAGNOSIS — G43109 Migraine with aura, not intractable, without status migrainosus: Secondary | ICD-10-CM | POA: Insufficient documentation

## 2011-08-01 DIAGNOSIS — J45909 Unspecified asthma, uncomplicated: Secondary | ICD-10-CM | POA: Insufficient documentation

## 2011-08-01 MED ORDER — TOPIRAMATE 200 MG PO TABS: 200.0000 mg | ORAL_TABLET | Freq: Every day | ORAL | Status: DC

## 2011-08-01 MED ORDER — SUMATRIPTAN SUCCINATE 100 MG PO TABS: 100.0000 mg | ORAL_TABLET | Freq: Every day | ORAL | Status: DC

## 2011-08-01 MED ORDER — CHLORPROMAZINE HCL 25 MG PO TABS: 25.0000 mg | ORAL_TABLET | Freq: Every day | ORAL | Status: DC

## 2011-08-01 NOTE — Progress Notes (Signed)
Follow up Headache and needs letter for Gastric Surgery, that you recommend it.

## 2011-08-01 NOTE — Progress Notes (Signed)
S: Pt is in office today for follow up on migraine headaches. She has been doing very well up until last week when she had a 3 day headache. She did take her imitrex with good results although she had some side effects. She does [facility] for [facility]. She has done well going up on Topamax to 200mg  without side effects. She has not lost any weight, in fact she has gained 14 lbs in last year. She is currently seeking Bariatric surgery. Since out last visit she saw Dr Nicholaus Bloom for laparoscopy with findings of adhesions from past C/S. She will schedule her for hysterectomy after Bariatric surgery. Casi has also been seeing Dr Ferdinand Cava in [facility] for GYN care. He has had her on BCPs, though she has discontinued them recently.   O: Obese, 40 yo Hispanic female NAD. HEENT: negative Neuro: negative  A: migraine without aura Morbid obesity IDDM  P: We had a lengthy discussion today concerning Fittys overall health plan. First as far as her headaches are concerned she will remain on topamax 200 mg daily, Imitrex prn and thorazine for rescue. Refills will be provided. She is asked to discontinue and remain off BCPs due to her increased risk of stroke with age, obesity, IDDM and migraine. She understands this. Next she is asking for and will be given a letter of recommendation for her Bariatric surgery. She will obtain a letter from all of her health care providers. She will F/U in one year or prn

## 2011-08-02 NOTE — Telephone Encounter (Signed)
Lmomtcb. If this is a plain and simple WC patient and not an IME case this can be scheduled at the front desk

## 2011-08-02 NOTE — Telephone Encounter (Signed)
Set appt.  For RB 10/9 @ 4:00 pm.  [name]

## 2011-08-02 NOTE — Telephone Encounter (Signed)
Toniann Fail called & stated this is a straight WC pt.  Toniann Fail can be reached at  [phone].  Antionette Fairy

## 2011-08-02 NOTE — Telephone Encounter (Signed)
lmtcb

## 2011-08-07 ENCOUNTER — Institutional Professional Consult (permissible substitution): Payer: Self-pay | Admitting: Pulmonary Disease

## 2011-08-15 ENCOUNTER — Ambulatory Visit (INDEPENDENT_AMBULATORY_CARE_PROVIDER_SITE_OTHER): Payer: Self-pay | Admitting: Emergency Medicine

## 2011-08-15 ENCOUNTER — Encounter: Payer: Self-pay | Admitting: Emergency Medicine

## 2011-08-15 DIAGNOSIS — J45909 Unspecified asthma, uncomplicated: Secondary | ICD-10-CM

## 2011-08-15 NOTE — Assessment & Plan Note (Signed)
-   will obtain the CXR and spirometries from urgent care - repeat full PFT - stop Advair for now - continue allergy regimen - rov next available

## 2011-08-15 NOTE — Progress Notes (Signed)
Subjective:    Patient ID: [name], female    DOB: [birth year], 40 y.o.   MRN: [id number]  HPI 40 yo woman, never smoker, Hx DM. 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 Started to experience burning in nose and mouth, bad taste in mouth. She experienced some SOB when exposed to the dust. This has persisted to a lesser degree even when her exposure level decreased. She has been wearing a mask for 4 months when at work. Happens at work more than at home when not exposed. Happened at first with activity, then progressed to be bothersome at rest. Sometimes burning in nose, a lot of throat clearing. Minimal cough. She has been seen at Urgent care, treated with Advair, nasal steroid, loratadine, singulair. She thinks the albuterol is helpful, but not sure about the Advair.    Review of Systems  Constitutional: Negative.  Negative for fever and unexpected weight change.  HENT: Positive for congestion. Negative for ear pain, nosebleeds, sore throat, rhinorrhea, sneezing, trouble swallowing, dental problem, postnasal drip and sinus pressure.   Eyes: Negative.  Negative for redness and itching.  Respiratory: Positive for cough and shortness of breath. Negative for chest tightness and wheezing.   Cardiovascular: Positive for palpitations. Negative for leg swelling.  Gastrointestinal: Negative.  Negative for nausea and vomiting.  Genitourinary: Negative.  Negative for dysuria.  Musculoskeletal: Positive for joint swelling.  Skin: Negative.  Negative for rash.  Neurological: Positive for headaches.  Hematological: Negative.  Does not bruise/bleed easily.  Psychiatric/Behavioral: Negative.  Negative for dysphoric mood. The patient is not nervous/anxious.     Past Medical History  Diagnosis Date  . PONV (postoperative nausea and vomiting)   . Headache     migraines  . Diabetes mellitus   . Plantar fascia syndrome   . Heel spur    bilateral  . GERD (gastroesophageal reflux disease)     no meds currently  . Shortness of breath     exposure to dust recently  . Anxiety     no meds  . Fatty liver      Family History  Problem Relation Age of Onset  . Hypertension Maternal Grandmother   . Diabetes Maternal Grandmother   . Hypertension Maternal Grandfather   . Heart disease Maternal Grandfather      History   Social History  . Marital Status: Single    Spouse Name: N/A    Number of Children: N/A  . Years of Education: N/A   Occupational History  . Not on file.   Social History Main Topics  . Smoking status: Never Smoker   . Smokeless tobacco: Not on file  . Alcohol Use: No  . Drug Use: No  . Sexually Active: Not on file   Other Topics Concern  . Not on file   Social History Narrative  . No narrative on file     No Known Allergies   Outpatient Prescriptions Prior to Visit  Medication Sig Dispense Refill  . albuterol-ipratropium (COMBIVENT) 18-103 MCG/ACT inhaler Inhale 2 puffs into the lungs every 6 (six) hours as needed.        . baclofen (LIORESAL) 10 MG tablet Take 10 mg by mouth 2 (two) times daily.        . benzonatate (TESSALON) 100 MG capsule Take 100 mg by mouth daily.        . chlorproMAZINE (THORAZINE) 25 MG tablet Take 1 tablet (25 mg total) by  mouth daily. As needed for migraines  30 tablet  0  . diclofenac (VOLTAREN) 50 MG EC tablet Take 50 mg by mouth 2 (two) times daily.        . diclofenac sodium (VOLTAREN) 1 % GEL Apply 1 application topically at bedtime. As needed for pain        . Fluticasone-Salmeterol (ADVAIR DISKUS) 100-50 MCG/DOSE AEPB Inhale 1 puff into the lungs every 12 (twelve) hours.        Marland Kitchen ibuprofen (ADVIL,MOTRIN) 800 MG tablet Take 800 mg by mouth every 8 (eight) hours as needed. For pain       . insulin aspart (NOVOLOG) 100 UNIT/ML injection Inject 40-50 Units into the skin daily. Inject seven times daily before meal       . insulin glargine (LANTUS) 100 UNIT/ML  injection Inject 50-75 Units into the skin at bedtime.       . magnesium citrate solution Take 296 mLs by mouth once.  300 mL  2  . metformin (FORTAMET) 500 MG (OSM) 24 hr tablet Take 2,000 mg by mouth at bedtime.        . mometasone (NASONEX) 50 MCG/ACT nasal spray Place 2 sprays into the nose daily. One spray in each nostril before work (for allergy)       . montelukast (SINGULAIR) 10 MG tablet Take 10 mg by mouth at bedtime.        . naftifine (NAFTIN) 1 % cream Apply 1 application topically at bedtime. As needed for vitamin E       . naproxen sodium (ANAPROX) 550 MG tablet Take 550 mg by mouth daily. As needed for pain       . ONE TOUCH ULTRA TEST test strip 8 times daily.      . polyethylene glycol powder (GLYCOLAX/MIRALAX) powder as needed.      . SUMAtriptan (IMITREX) 100 MG tablet Take 1 tablet (100 mg total) by mouth daily. As needed for migraines  9 tablet  11  . topiramate (TOPAMAX) 200 MG tablet Take 1 tablet (200 mg total) by mouth daily.  30 tablet  11  . traMADol (ULTRAM) 50 MG tablet Take 50 mg by mouth 2 (two) times daily.        . urea (CARMOL) 40 % CREA as needed.      Marland Kitchen HYDROcodone-acetaminophen (VICODIN) 5-500 MG per tablet Take 1 tablet by mouth every 4 (four) hours as needed. For pain       . PRENATAL VITAMINS PO Take 1 tablet by mouth daily.             Objective:   Physical Exam  Gen: Pleasant, obese, in no distress,  normal affect  ENT: No lesions,  mouth clear,  oropharynx clear, no postnasal drip  Neck: No JVD, no TMG, no carotid bruits  Lungs: No use of accessory muscles, no dullness to percussion, clear without rales or rhonchi  Cardiovascular: RRR, heart sounds normal, no murmur or gallops, no peripheral edema  Musculoskeletal: No deformities, no cyanosis or clubbing  Neuro: alert, non focal  Skin: Warm, no lesions or rashes      Assessment & Plan:  Asthma - will obtain the CXR and spirometries from urgent care - repeat full PFT - stop Advair  for now - continue allergy regimen - rov next available

## 2011-08-15 NOTE — Patient Instructions (Signed)
We will perform full pulmonary function testing at you next office visit Stop Advair Keep albuterol available to use as needed Continue your singulair, loratadine and nasal spray for now Follow up with Dr Delton Coombes next available

## 2011-09-14 ENCOUNTER — Ambulatory Visit (INDEPENDENT_AMBULATORY_CARE_PROVIDER_SITE_OTHER): Payer: Self-pay | Admitting: Emergency Medicine

## 2011-09-14 ENCOUNTER — Encounter: Payer: Self-pay | Admitting: Emergency Medicine

## 2011-09-14 DIAGNOSIS — J45909 Unspecified asthma, uncomplicated: Secondary | ICD-10-CM

## 2011-09-14 DIAGNOSIS — J309 Allergic rhinitis, unspecified: Secondary | ICD-10-CM | POA: Insufficient documentation

## 2011-09-14 LAB — PULMONARY FUNCTION TEST

## 2011-09-14 NOTE — Assessment & Plan Note (Addendum)
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  - prn albuterol - rov 1 mon - if no better, consider CT scan to r/o ground glass, bronchiectasis, etc.

## 2011-09-14 NOTE — Progress Notes (Signed)
PFT done today. 

## 2011-09-14 NOTE — Progress Notes (Signed)
  Subjective:    Patient ID: [name], female    DOB: [birth year], 40 y.o.   MRN: [id number]  HPI 40 yo woman, never smoker, Hx DM. 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 Started to experience burning in nose and mouth, bad taste in mouth. She experienced some SOB when exposed to the dust. This has persisted to a lesser degree even when her exposure level decreased. She has been wearing a mask for 4 months when at work. Happens at work more than at home when not exposed. Happened at first with activity, then progressed to be bothersome at rest. Sometimes burning in nose, a lot of throat clearing. Minimal cough. She has been seen at Urgent care, treated with Advair, nasal steroid, loratadine, singulair. She thinks the albuterol is helpful, but not sure about the Advair.   ROV 09/14/11 -- follow up visit for UA symptoms and possible asthma, exacerbated by exposures of dust at work. We stopped Advair last time, she wasn't really missing it. This last week she has noticed more exertional SOB. She continues to clear throat frequently. Has remained on fluticasone qd and loratadine, singulair. She had PFT today. Her cough is freq and dry, usually non-productive.      Objective:   Physical Exam  Gen: Pleasant, obese, in no distress,  normal affect  ENT: No lesions,  mouth clear,  oropharynx clear, no postnasal drip  Neck: No JVD, no TMG, no carotid bruits  Lungs: No use of accessory muscles, no dullness to percussion, clear without rales or rhonchi  Cardiovascular: RRR, heart sounds normal, no murmur or gallops, no peripheral edema  Musculoskeletal: No deformities, no cyanosis or clubbing  Neuro: alert, non focal  Skin: Warm, no lesions or rashes      Assessment & Plan:  Asthma PFT show mild AFL with a vigorous BD response  Allergic rhinitis - continue loratadine, singulair, nasal steroid.

## 2011-09-14 NOTE — Patient Instructions (Signed)
Please continue your nasal steroid, loratadine and singulair Start Dulera 100/36mcg 2 puffs twice a day until next visit Use your albuterol 2 puffs as needed Follow with Dr Delton Coombes in 1 month to assess your progress If you continue to have problems with your breathing at that time then we will consider further evaluation including a CT scan of the chest.

## 2011-09-14 NOTE — Assessment & Plan Note (Signed)
-   continue loratadine, singulair, nasal steroid.

## 2011-09-22 ENCOUNTER — Other Ambulatory Visit: Payer: Self-pay | Admitting: Internal Medicine

## 2011-09-22 DIAGNOSIS — M79604 Pain in right leg: Secondary | ICD-10-CM

## 2011-10-18 ENCOUNTER — Telehealth: Payer: Self-pay | Admitting: Emergency Medicine

## 2011-10-18 NOTE — Telephone Encounter (Signed)
Called spoke with patient who stated that her breathing is the same since last ov with RB, but she had to reschedule her 12.13.12 appt with RB b/c she now has the flu.  Pt rsc to 12.20.12 and is requesting another sample of the dulera 100 given at last ov.  1 sample left up front for pt to pick up at her convenience. Pt verbalized her understanding.

## 2011-10-19 ENCOUNTER — Ambulatory Visit: Payer: 59 | Admitting: Emergency Medicine

## 2011-10-26 ENCOUNTER — Encounter: Payer: Self-pay | Admitting: Emergency Medicine

## 2011-10-26 ENCOUNTER — Ambulatory Visit (INDEPENDENT_AMBULATORY_CARE_PROVIDER_SITE_OTHER): Payer: Self-pay | Admitting: Emergency Medicine

## 2011-10-26 DIAGNOSIS — J45909 Unspecified asthma, uncomplicated: Secondary | ICD-10-CM

## 2011-10-26 DIAGNOSIS — J309 Allergic rhinitis, unspecified: Secondary | ICD-10-CM

## 2011-10-26 MED ORDER — OMEPRAZOLE 20 MG PO CPDR: 20.0000 mg | DELAYED_RELEASE_CAPSULE | Freq: Every day | ORAL | Status: DC

## 2011-10-26 NOTE — Assessment & Plan Note (Signed)
Loratadine Nasal steroid singulair

## 2011-10-26 NOTE — Progress Notes (Signed)
  Subjective:    Patient ID: [name], female    DOB: 27-Jan-1971, 40 y.o.   MRN: [id number]  HPI 40 yo woman, never smoker, Hx DM. 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 Started to experience burning in nose and mouth, bad taste in mouth. She experienced some SOB when exposed to the dust. This has persisted to a lesser degree even when her exposure level decreased. She has been wearing a mask for 4 months when at work. Happens at work more than at home when not exposed. Happened at first with activity, then progressed to be bothersome at rest. Sometimes burning in nose, a lot of throat clearing. Minimal cough. She has been seen at Urgent care, treated with Advair, nasal steroid, loratadine, singulair. She thinks the albuterol is helpful, but not sure about the Advair.   ROV 09/14/11 -- follow up visit for UA symptoms and possible asthma, exacerbated by exposures of dust at work. We stopped Advair last time, she wasn't really missing it. This last week she has noticed more exertional SOB. She continues to clear throat frequently. Has remained on fluticasone qd and loratadine, singulair. She had PFT today. Her cough is freq and dry, usually non-productive.   ROV 10/26/11 -- f/u allergies and asthma identified on PFT by vigorous BD response. Last time stated [facility], treated allergies aggressively. She felt that the [facility] helped w SOB, she ran out 1 week ago. Unfortunately caught URI in the interim and has needed more SABA, has had more cough.    PULMONARY FUNCTON TEST 09/14/2011  FVC 2.47  FEV1 2.08  FEV1/FVC 84.2  FVC  % Predicted 73  FEV % Predicted 79  FeF 25-75 2.71  FeF 25-75 % Predicted 3.13       Objective:   Physical Exam  Gen: Pleasant, obese, in no distress,  normal affect  ENT: No lesions,  mouth clear,  oropharynx clear, no postnasal drip  Neck: No JVD, no TMG, no carotid bruits  Lungs: No use of accessory muscles,  no dullness to percussion, clear without rales or rhonchi  Cardiovascular: RRR, heart sounds normal, no murmur or gallops, no peripheral edema  Musculoskeletal: No deformities, no cyanosis or clubbing  Neuro: alert, non focal  Skin: Warm, no lesions or rashes      Assessment & Plan:  Asthma Restart Dulera now SABA prn Will try rx for GERD to see if this helps w nocturnal sx tussionex prn for cough w URI  Allergic rhinitis Loratadine Nasal steroid singulair

## 2011-10-26 NOTE — Assessment & Plan Note (Signed)
Restart Dulera now SABA prn Will try rx for GERD to see if this helps w nocturnal sx tussionex prn for cough w URI

## 2011-10-26 NOTE — Patient Instructions (Signed)
Restart Dulera 2 puffs twice a day Use albuterol as needed Continue your loratadine, singulair and nasal spray Start omeprazole 20mg  daily Follow with dr Delton Coombes in 1 month

## 2011-12-01 ENCOUNTER — Ambulatory Visit: Payer: 59 | Admitting: Emergency Medicine

## 2011-12-26 ENCOUNTER — Encounter: Payer: Self-pay | Admitting: Emergency Medicine

## 2011-12-26 ENCOUNTER — Ambulatory Visit (INDEPENDENT_AMBULATORY_CARE_PROVIDER_SITE_OTHER): Payer: Self-pay | Admitting: Emergency Medicine

## 2011-12-26 DIAGNOSIS — J45909 Unspecified asthma, uncomplicated: Secondary | ICD-10-CM

## 2011-12-26 NOTE — Patient Instructions (Addendum)
Please restart Dulera twice a day Use albuterol 2 puffs as needed You may substitute Qnasl 2 sprays each nostril for the nasonex Follow with Dr Delton Coombes in 2 months or sooner if you have any problems.   Here are the medications we need to get for you through workman's compensation:  1. Dulera 200/61mcg 2 puffs twice a day 2. Fluticasone nasal spray, 2 sprays each nostril twice a day 3. Loratadine 10mg  daily 4. Singulair 10mg  each evening 5. Ventolin 2 puffs up to every 4 hours if needed for shortness of breath

## 2011-12-26 NOTE — Assessment & Plan Note (Signed)
She has been unable to get her meds so far, trying to get thru workman's compensation. Need to call her case manager to sort this out.  - resume Dulera bid - SABA prn - restart nasal steroid - will speak to workman's comp as above, needs to be able to pick up Singulair.

## 2011-12-26 NOTE — Progress Notes (Signed)
  Subjective:    Patient ID: [name], female    DOB: 1970-11-07, 41 y.o.   MRN: [id number]  HPI 41 yo woman, never smoker, Hx DM. 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 Started to experience burning in nose and mouth, bad taste in mouth. She experienced some SOB when exposed to the dust. This has persisted to a lesser degree even when her exposure level decreased. She has been wearing a mask for 4 months when at work. Happens at work more than at home when not exposed. Happened at first with activity, then progressed to be bothersome at rest. Sometimes burning in nose, a lot of throat clearing. Minimal cough. She has been seen at Urgent care, treated with Advair, nasal steroid, loratadine, singulair. She thinks the albuterol is helpful, but not sure about the Advair.   ROV 09/14/11 -- follow up visit for UA symptoms and possible asthma, exacerbated by exposures of dust at work. We stopped Advair last time, she wasn't really missing it. This last week she has noticed more exertional SOB. She continues to clear throat frequently. Has remained on fluticasone qd and loratadine, singulair. She had PFT today. Her cough is freq and dry, usually non-productive.   ROV 10/26/11 -- f/u allergies and asthma identified on PFT by vigorous BD response. Last time stated [facility], treated allergies aggressively. She felt that the [facility] helped w SOB, she ran out 1 week ago. Unfortunately caught URI in the interim and has needed more SABA, has had more cough.    PULMONARY FUNCTON TEST 09/14/2011  FVC 2.47  FEV1 2.08  FEV1/FVC 84.2  FVC  % Predicted 73  FEV % Predicted 79  FeF 25-75 2.71  FeF 25-75 % Predicted 3.13   ROV 12/26/11 -- f/u for Asthma and UA irritation with cough. Her cough is better since last time. Remains in environment at work to which she is sensitive. Remains on singulair, nasonex (ran out), ran out of [place] 1 week ago, feels worse off  of it. She is trying to get the meds through workman's comp, has been unable so far.     Objective:   Physical Exam  Gen: Pleasant, obese, in no distress,  normal affect  ENT: No lesions,  mouth clear,  oropharynx clear, no postnasal drip  Neck: No JVD, no TMG, no carotid bruits  Lungs: No use of accessory muscles, no dullness to percussion, clear without rales or rhonchi  Cardiovascular: RRR, heart sounds normal, no murmur or gallops, no peripheral edema  Musculoskeletal: No deformities, no cyanosis or clubbing  Neuro: alert, non focal  Skin: Warm, no lesions or rashes      Assessment & Plan:  Asthma She has been unable to get her meds so far, trying to get thru workman's compensation. Need to call her case manager to sort this out.  - resume Dulera bid - SABA prn - restart nasal steroid - will speak to workman's comp as above, needs to be able to pick up Singulair.

## 2012-01-02 IMAGING — US US TRANSVAGINAL NON-OB
1 series · 13 of 25 positions shown · non-contrast
Comparison: None.

CLINICAL DATA: Uterine leiomyoma.  Prior myomectomy.



[Series 1: us transvaginal non-ob · 0.30mm/px · 13 of 45 slices shown]
[im 1/45]
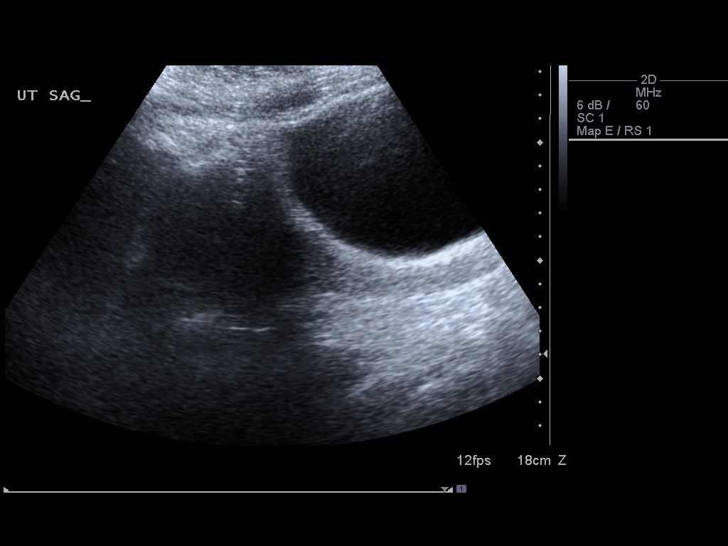
[im 4/45]
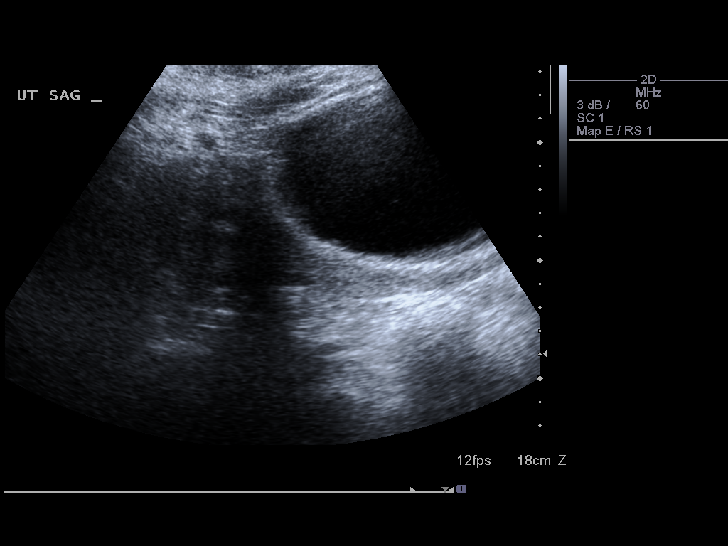
[im 8/45]
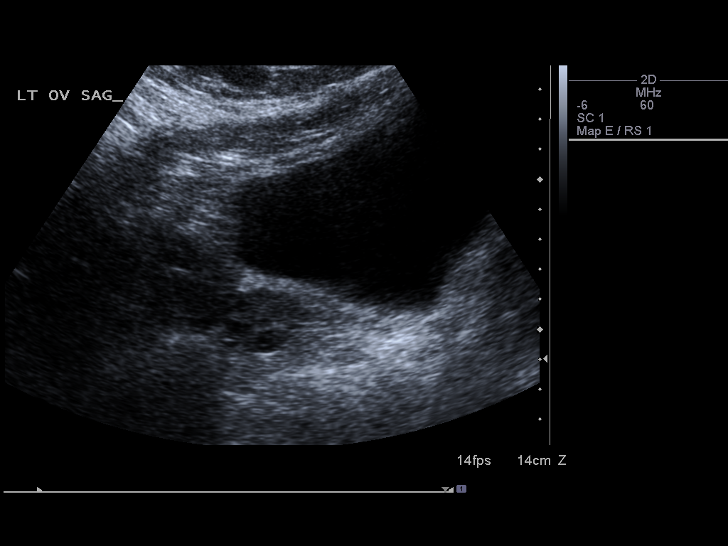
[im 12/45]
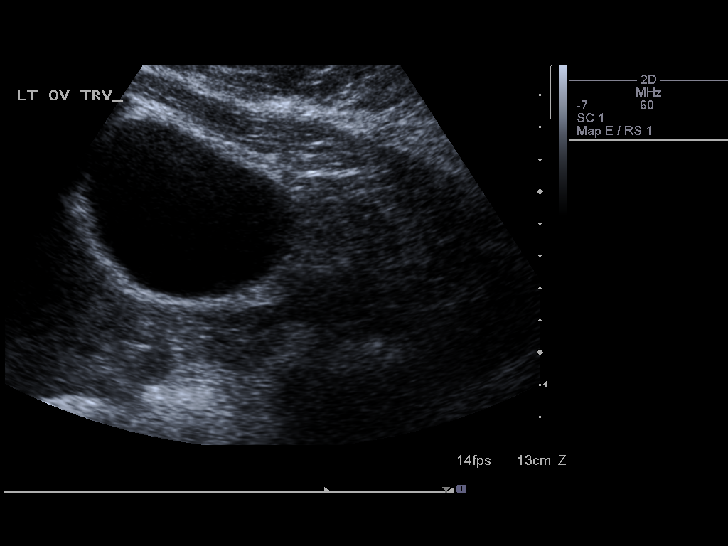
[im 15/45]
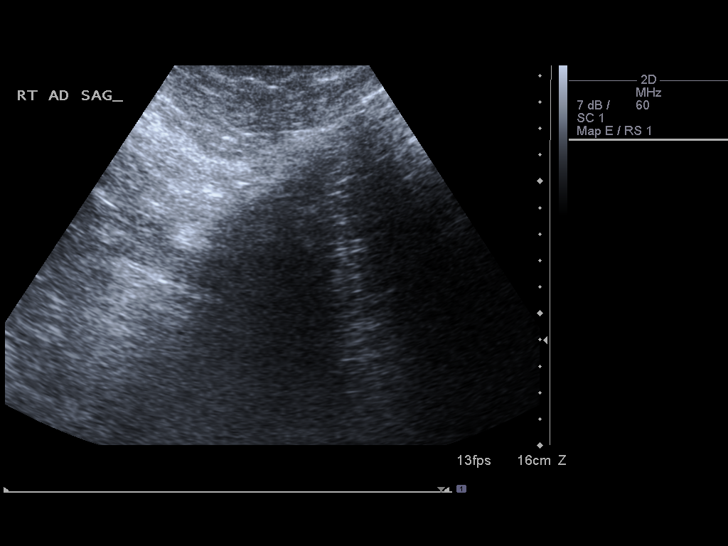
[im 19/45]
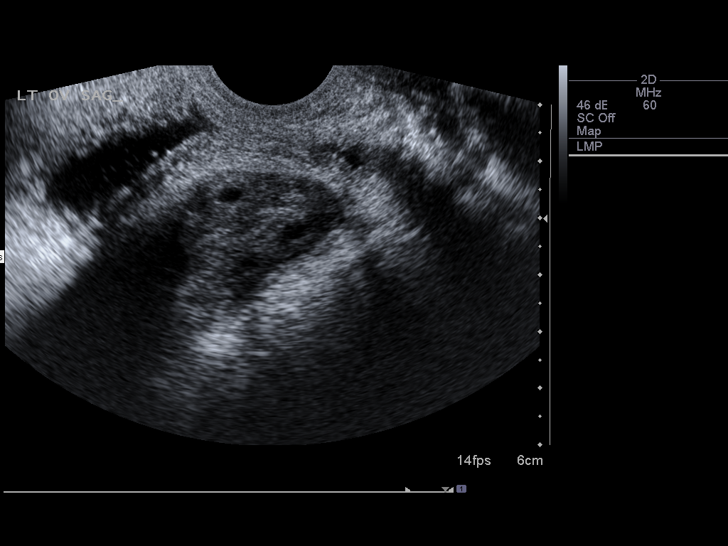
[im 23/45]
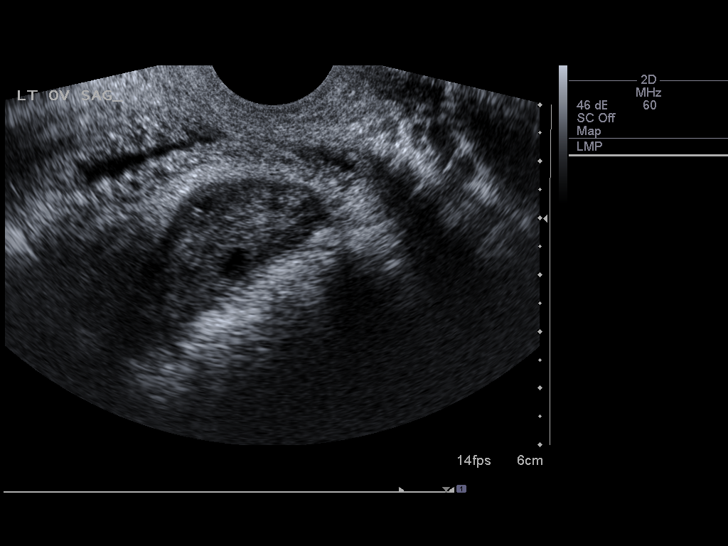
[im 26/45]
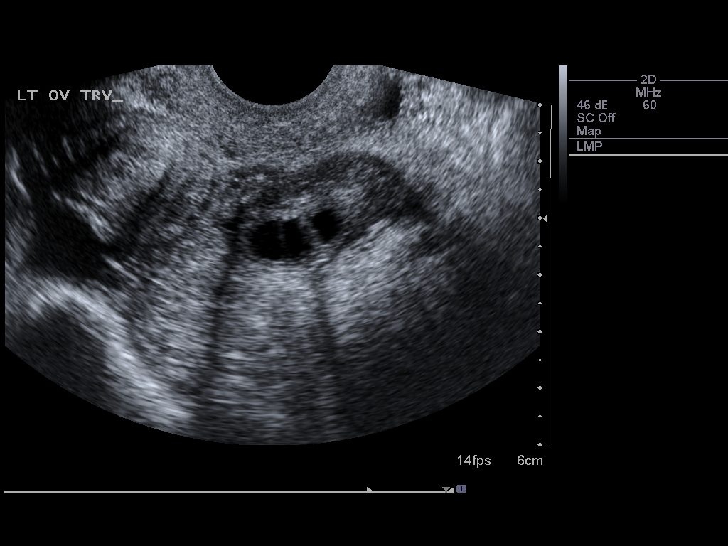
[im 30/45]
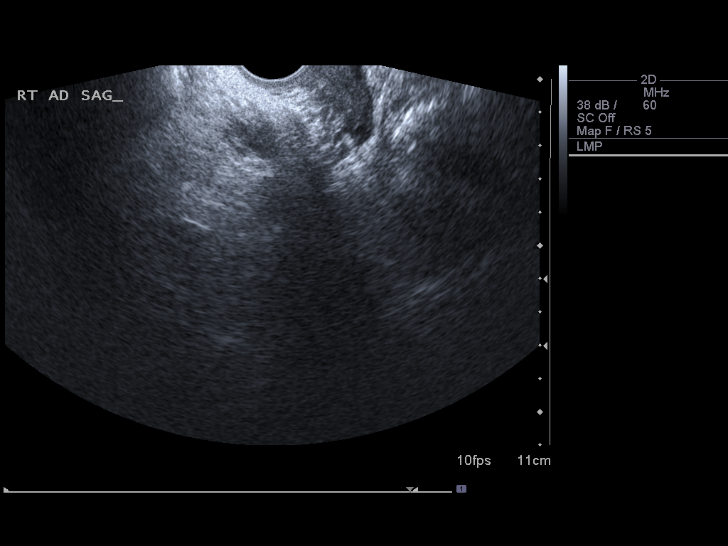
[im 34/45]
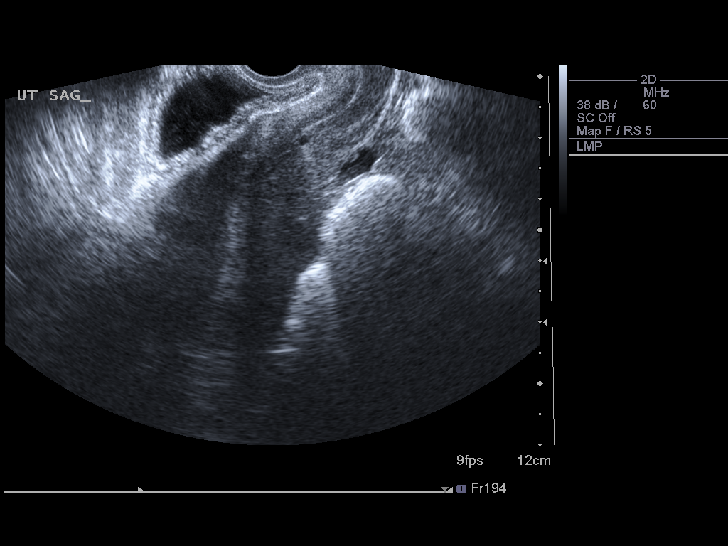
[im 37/45]
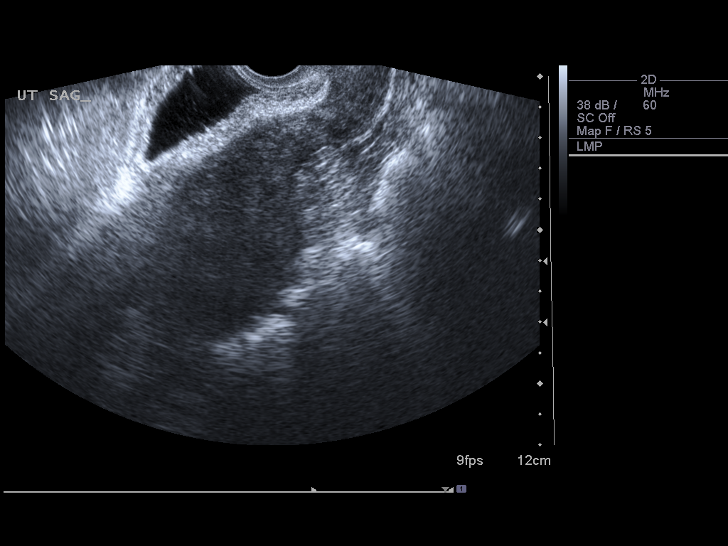
[im 41/45]
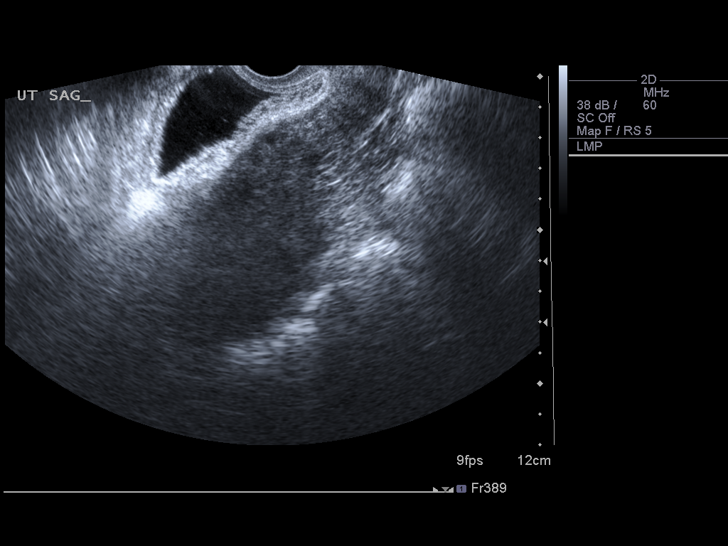
[im 45/45]
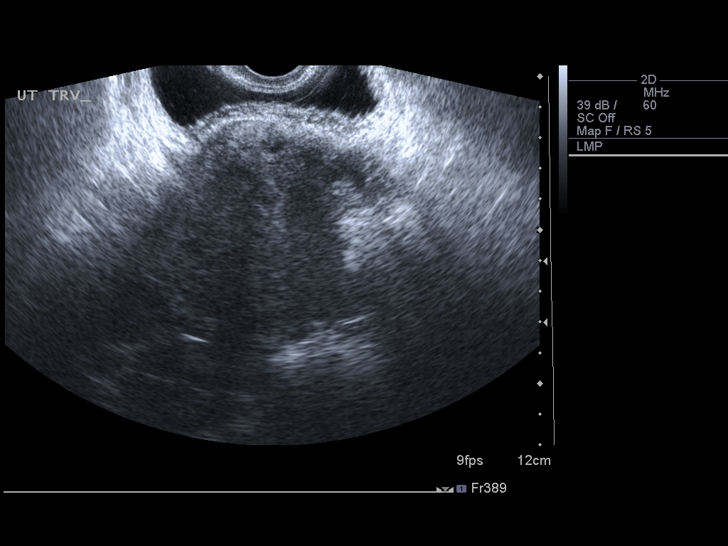

[13 of 25 positions shown; findings below may reference images not displayed]

FINDINGS: Uterus the uterus demonstrates a sagittal length of 11.0 cm, an AP
depth of 5.2 cm and a transverse width of 6.5 cm.  Because of
scanning parameters transabdominally combined with an anterior to
posterior positioning of the uterus on endovaginal exam, optimal
resolution of the myometrium was not obtained.  No definite focal
fibroids are seen, but evidence of small fibroids or adenomyosis
could be obscured on this exam.

Endometrium is homogeneously echogenic with an AP width of 8 mm.
No definite focal thickening or heterogeneity is seen.

Right Ovary is not seen with confidence either transabdominally or
endovaginally

Left Ovary has a normal appearance endovaginally measuring 2.2 x
3.2 x 2.1 cm.

Other Findings:  A small amount of simple free fluid is noted in
the cul-de-sac.
IMPRESSION: Poorly resolved uterine myometrium precluding optimal evaluation
for adenomyosis or small fibroids.  No large fibroids identified.

Normal endometrium and left ovary.  Non-visualized right ovary.

If further evaluation for adenomyosis or small fibroids is desired,
MRI would be recommended

## 2012-02-08 ENCOUNTER — Telehealth: Payer: Self-pay | Admitting: Emergency Medicine

## 2012-02-08 MED ORDER — MOMETASONE FURO-FORMOTEROL FUM 200-5 MCG/ACT IN AERO: 2.0000 | INHALATION_SPRAY | Freq: Two times a day (BID) | RESPIRATORY_TRACT | Status: DC

## 2012-02-08 MED ORDER — FLUTICASONE PROPIONATE 50 MCG/ACT NA SUSP: NASAL | Status: DC

## 2012-02-08 MED ORDER — LORATADINE 10 MG PO TABS: 10.0000 mg | ORAL_TABLET | Freq: Every day | ORAL | Status: DC

## 2012-02-08 MED ORDER — MONTELUKAST SODIUM 10 MG PO TABS: 10.0000 mg | ORAL_TABLET | Freq: Every day | ORAL | Status: DC

## 2012-02-08 MED ORDER — ALBUTEROL SULFATE HFA 108 (90 BASE) MCG/ACT IN AERS: 2.0000 | INHALATION_SPRAY | RESPIRATORY_TRACT | Status: DC | PRN

## 2012-02-08 NOTE — Telephone Encounter (Signed)
Pt returned triage's call & stated she would prefer 90 day supply if possible.  [name]

## 2012-02-08 NOTE — Telephone Encounter (Signed)
According RB last note the following was suppose to be called in:\ dulera 200 2 puffs BID, sing 10 qd, loratadine 10 mg qd, ventolin 2 puffs every 4 hrs prn, and fluticasone 2 sprays each nostril BID. [name] is going to call back and let us know if it needs to be 30 day supply or 90 day supply, Will wait her call back

## 2012-02-08 NOTE — Telephone Encounter (Signed)
I spoke with pt and is aware rx's have been sent. Nothing further was needed

## 2012-02-27 ENCOUNTER — Encounter: Payer: Self-pay | Admitting: Emergency Medicine

## 2012-02-27 ENCOUNTER — Ambulatory Visit (INDEPENDENT_AMBULATORY_CARE_PROVIDER_SITE_OTHER): Payer: Self-pay | Admitting: Emergency Medicine

## 2012-02-27 DIAGNOSIS — J45909 Unspecified asthma, uncomplicated: Secondary | ICD-10-CM

## 2012-02-27 NOTE — Assessment & Plan Note (Signed)
Biggest issue right now seems to be getting her meds reliably, getting them paid for thru worker's comp. She runs out every month and then they dont get paid for for the refill.  - need to speak to the worker's comp caseworker to get this fixed.

## 2012-02-27 NOTE — Progress Notes (Signed)
Subjective:    Patient ID: [name], female    DOB: 1970-12-31, 41 y.o.   MRN: [id number]  HPI 41 yo woman, never smoker, Hx DM. 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 Started to experience burning in nose and mouth, bad taste in mouth. She experienced some SOB when exposed to the dust. This has persisted to a lesser degree even when her exposure level decreased. She has been wearing a mask for 4 months when at work. Happens at work more than at home when not exposed. Happened at first with activity, then progressed to be bothersome at rest. Sometimes burning in nose, a lot of throat clearing. Minimal cough. She has been seen at Urgent care, treated with Advair, nasal steroid, loratadine, singulair. She thinks the albuterol is helpful, but not sure about the Advair.   ROV 09/14/11 -- follow up visit for UA symptoms and possible asthma, exacerbated by exposures of dust at work. We stopped Advair last time, she wasn't really missing it. This last week she has noticed more exertional SOB. She continues to clear throat frequently. Has remained on fluticasone qd and loratadine, singulair. She had PFT today. Her cough is freq and dry, usually non-productive.   ROV 10/26/11 -- f/u allergies and asthma identified on PFT by vigorous BD response. Last time stated [facility], treated allergies aggressively. She felt that the [facility] helped w SOB, she ran out 1 week ago. Unfortunately caught URI in the interim and has needed more SABA, has had more cough.    PULMONARY FUNCTON TEST 09/14/2011  FVC 2.47  FEV1 2.08  FEV1/FVC 84.2  FVC  % Predicted 73  FEV % Predicted 79  FeF 25-75 2.71  FeF 25-75 % Predicted 3.13   ROV 12/26/11 -- f/u for Asthma and UA irritation with cough. Her cough is better since last time. Remains in environment at work to which she is sensitive. Remains on singulair, nasonex (ran out), ran out of [place] 1 week ago, feels worse off  of it. She is trying to get the meds through workman's comp, has been unable so far.   ROV 02/27/12 -- f/u for Asthma and UA irritation with cough. She has been stable, still with SABA use but rarely. She ran out of [facility] 1 week ago, has not been able to refill because worker comp has not reliably paid. She can tell that she misses it. Was treated for possible bronchitis/sinusitis 2 weeks ago, no steroids given. Stable nasal congestion. She is planning for gastric bypass sgy in the near future. She has been relying on samples for her nasal steroid, not taking right now, not taking loratadine.     Objective:   Physical Exam  Gen: Pleasant, obese, in no distress,  normal affect  ENT: No lesions,  mouth clear,  oropharynx clear, no postnasal drip  Neck: No JVD, no TMG, no carotid bruits  Lungs: No use of accessory muscles, no dullness to percussion, clear without rales or rhonchi  Cardiovascular: RRR, heart sounds normal, no murmur or gallops, no peripheral edema  Musculoskeletal: No deformities, no cyanosis or clubbing  Neuro: alert, non focal  Skin: Warm, no lesions or rashes      Assessment & Plan:  Asthma Biggest issue right now seems to be getting her meds reliably, getting them paid for thru worker's comp. She runs out every month and then they dont get paid for for the refill.  - need to speak to  the worker's comp caseworker to get this fixed.

## 2012-02-27 NOTE — Patient Instructions (Signed)
We will restart your Dulera and nasal steroid.  We will call your worker's compensation caseworker to figure out why we are unable to reliably get refills.  Follow up in 3 months

## 2012-03-21 ENCOUNTER — Telehealth: Payer: Self-pay

## 2012-03-21 NOTE — Telephone Encounter (Signed)
Patient called you twice yesterday, left two voicemail's. She was returning your call. She would like for you to call her when as soon as you can. To her number [phone].

## 2012-03-28 ENCOUNTER — Other Ambulatory Visit: Payer: 59

## 2012-06-05 ENCOUNTER — Encounter: Payer: Self-pay | Admitting: Emergency Medicine

## 2012-06-05 ENCOUNTER — Ambulatory Visit (INDEPENDENT_AMBULATORY_CARE_PROVIDER_SITE_OTHER): Payer: Self-pay | Admitting: Emergency Medicine

## 2012-06-05 VITALS — BP 100/62 | HR 55 | Temp 98.3°F | Ht 62.0 in | Wt 160.6 lb

## 2012-06-05 DIAGNOSIS — J45909 Unspecified asthma, uncomplicated: Secondary | ICD-10-CM

## 2012-06-05 NOTE — Assessment & Plan Note (Signed)
-   continue the loratadine + nasal steroid.

## 2012-06-05 NOTE — Assessment & Plan Note (Addendum)
Stable on Dulera - continue same.  - need to clarify workman's comp issues for meds

## 2012-06-05 NOTE — Patient Instructions (Addendum)
Please continue your [facility] twice a day Continue your loratadine and nasal spray as you are taking them We will attempt to confirm your Worker's Comp Coverage for your [facility].  Follow with Dr Delton Coombes in 6 months or sooner if you have any problems

## 2012-06-05 NOTE — Progress Notes (Signed)
Subjective:    Patient ID: [name], female    DOB: 06/30/1971, 41 y.o.   MRN: [id number]  HPI 41 yo woman, never smoker, Hx DM. 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 Started to experience burning in nose and mouth, bad taste in mouth. She experienced some SOB when exposed to the dust. This has persisted to a lesser degree even when her exposure level decreased. She has been wearing a mask for 4 months when at work. Happens at work more than at home when not exposed. Happened at first with activity, then progressed to be bothersome at rest. Sometimes burning in nose, a lot of throat clearing. Minimal cough. She has been seen at Urgent care, treated with Advair, nasal steroid, loratadine, singulair. She thinks the albuterol is helpful, but not sure about the Advair.   ROV 09/14/11 -- follow up visit for UA symptoms and possible asthma, exacerbated by exposures of dust at work. We stopped Advair last time, she wasn't really missing it. This last week she has noticed more exertional SOB. She continues to clear throat frequently. Has remained on fluticasone qd and loratadine, singulair. She had PFT today. Her cough is freq and dry, usually non-productive.   ROV 10/26/11 -- f/u allergies and asthma identified on PFT by vigorous BD response. Last time stated [facility], treated allergies aggressively. She felt that the [facility] helped w SOB, she ran out 1 week ago. Unfortunately caught URI in the interim and has needed more SABA, has had more cough.    PULMONARY FUNCTON TEST 09/14/2011  FVC 2.47  FEV1 2.08  FEV1/FVC 84.2  FVC  % Predicted 73  FEV % Predicted 79  FeF 25-75 2.71  FeF 25-75 % Predicted 3.13   ROV 12/26/11 -- f/u for Asthma and UA irritation with cough. Her cough is better since last time. Remains in environment at work to which she is sensitive. Remains on singulair, nasonex (ran out), ran out of [place] 1 week ago, feels worse off  of it. She is trying to get the meds through workman's comp, has been unable so far.   ROV 02/27/12 -- f/u for Asthma and UA irritation with cough. She has been stable, still with SABA use but rarely. She ran out of [facility] 1 week ago, has not been able to refill because worker comp has not reliably paid. She can tell that she misses it. Was treated for possible bronchitis/sinusitis 2 weeks ago, no steroids given. Stable nasal congestion. She is planning for gastric bypass sgy in the near future. She has been relying on samples for her nasal steroid, not taking right now, not taking loratadine.   ROV 06/05/12 -- f/u for Asthma and UA irritation with cough. Nasal allergies have made control difficult, as well as exposures at work.  She is now s/p gastric banding and she hasn't ben at work - the absence of this exposure helped her throat and breathing. She was able to stop Dulera, decreased her usage of loratadine, nasal steroid. She is now back on all three. Her breathing has been stable. No flares since last time.     Objective:  Physical Exam Filed Vitals:   06/05/12 1624  BP: 100/62  Pulse: 55  Temp: 98.3 F (36.8 C)    Gen: Pleasant, obese, in no distress,  normal affect  ENT: No lesions,  mouth clear,  oropharynx clear, no postnasal drip  Neck: No JVD, no TMG, no carotid bruits  Lungs: No use of accessory muscles, no dullness to percussion, clear without rales or rhonchi  Cardiovascular: RRR, heart sounds normal, no murmur or gallops, no peripheral edema  Musculoskeletal: No deformities, no cyanosis or clubbing  Neuro: alert, non focal  Skin: Warm, no lesions or rashes      Assessment & Plan:  Asthma Stable on Dulera - continue same.  - need to clarify workman's comp issues for meds   Allergic rhinitis - continue the loratadine + nasal steroid.

## 2012-08-19 IMAGING — US US PELVIS COMPLETE
1 series · 13 of 25 positions shown · non-contrast
Comparison: 07/21/2010

CLINICAL DATA: Dysfunctional uterine bleeding. Previous myomectomy
for fibroids.

TRANSABDOMINAL AND TRANSVAGINAL ULTRASOUND OF PELVIS
TECHNIQUE: Both transabdominal and transvaginal ultrasound
examinations of the pelvis were performed.  Transabdominal
technique was performed for global imaging of the pelvis including
uterus, ovaries, adnexal regions, and pelvic cul-de-sac.
It was necessary to proceed with endovaginal exam following the
transabdominal exam to visualize the endometrium and left ovarian
cysts.

[Series 1: us pelvis complete · 49 acquisitions, 13 frames shown]
[im 1/49]
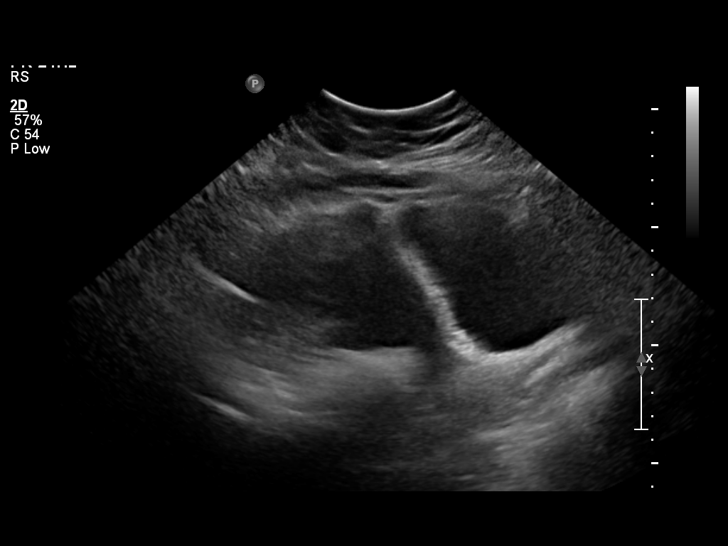
[im 5/49]
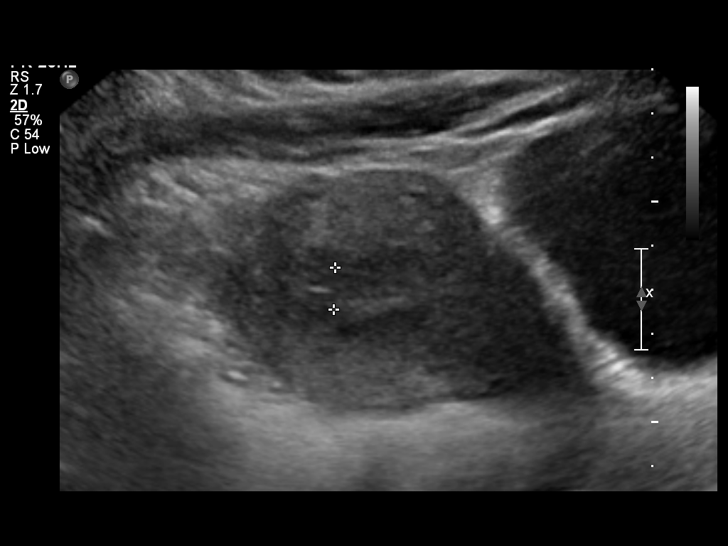
[im 9/49]
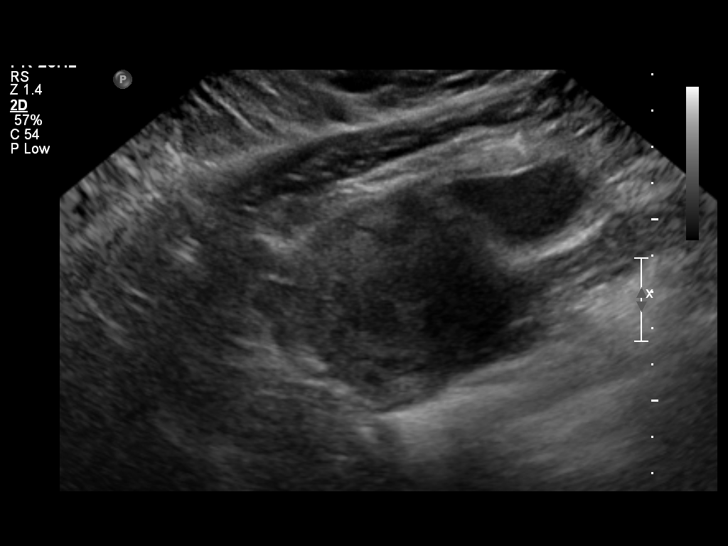
[im 13/49]
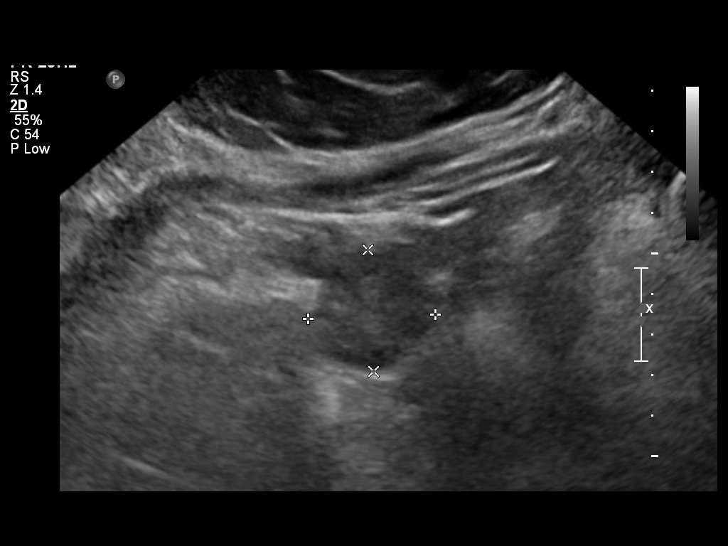
[im 17/49]
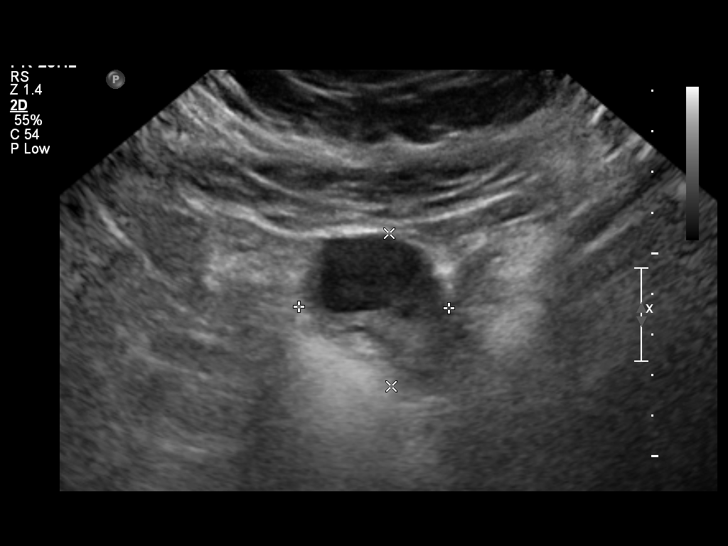
[im 21/49]
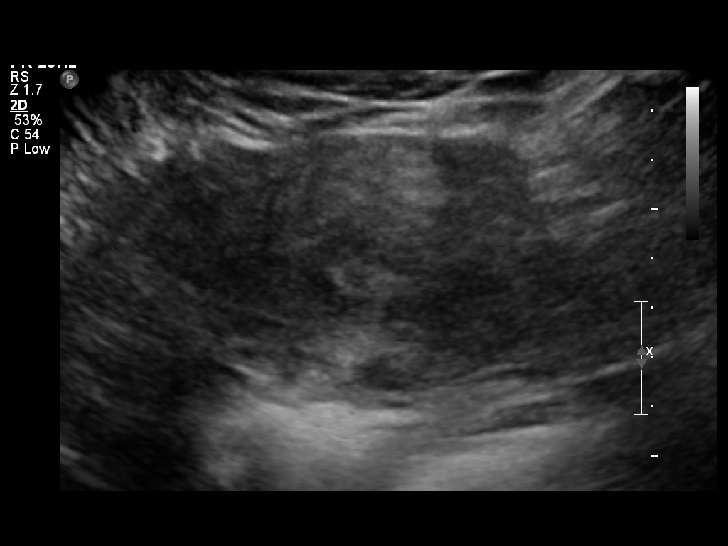
[im 25/49]
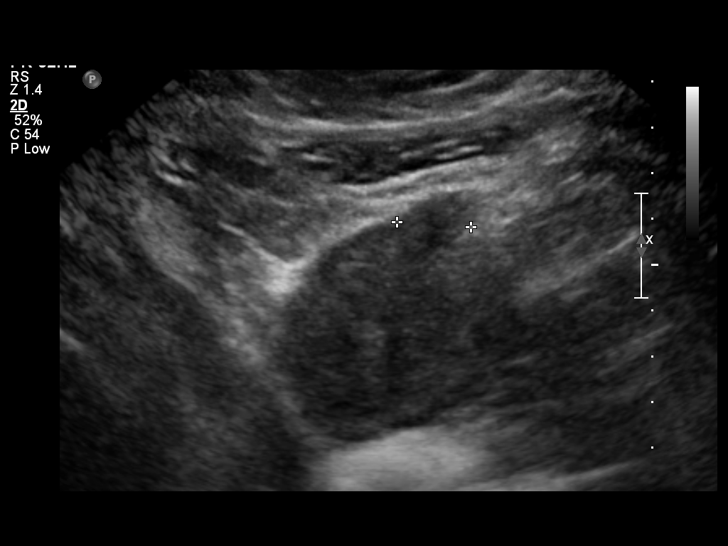
[im 29/49]
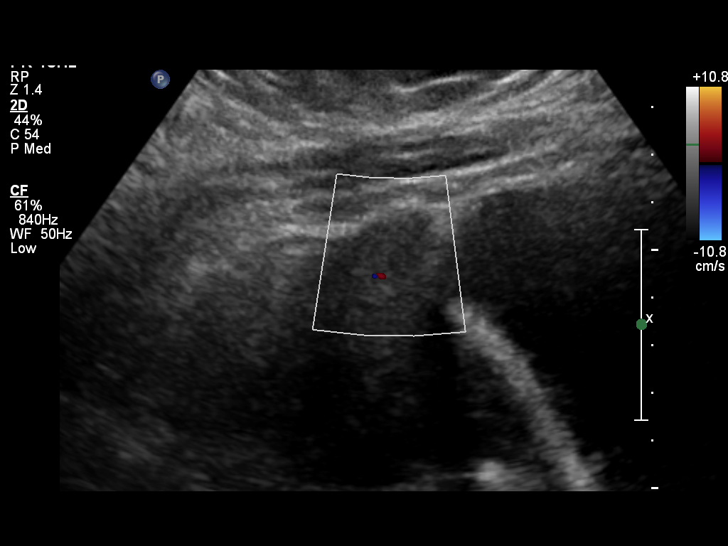
[im 33/49]
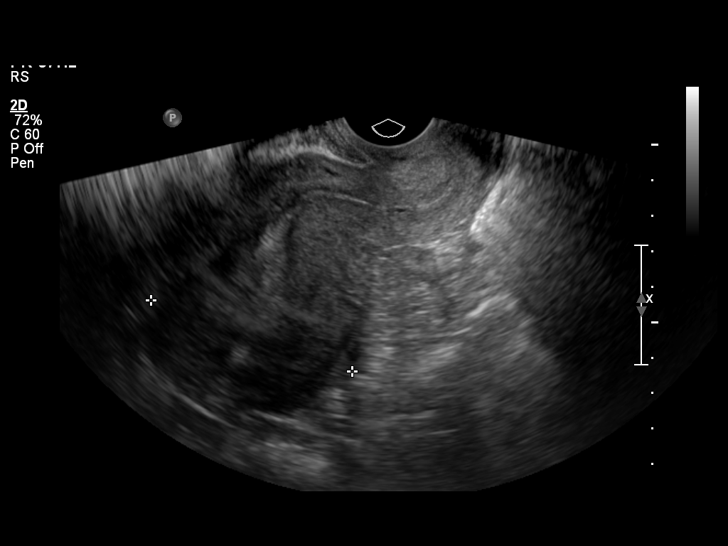
[im 37/49]
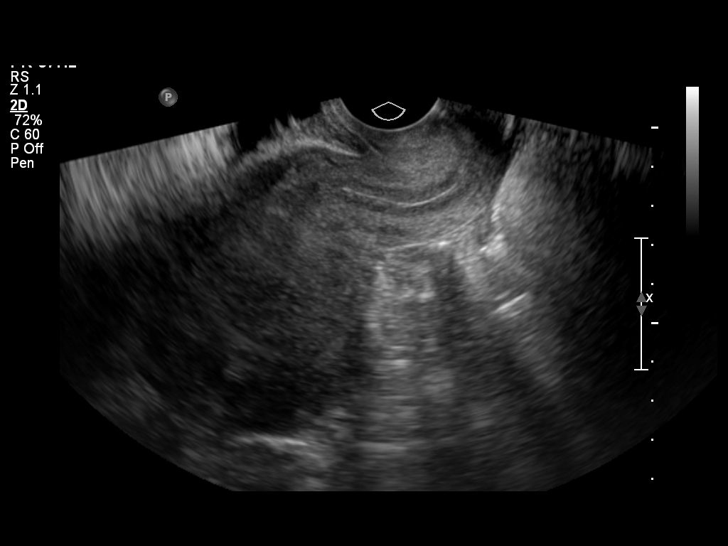
[im 41/49]
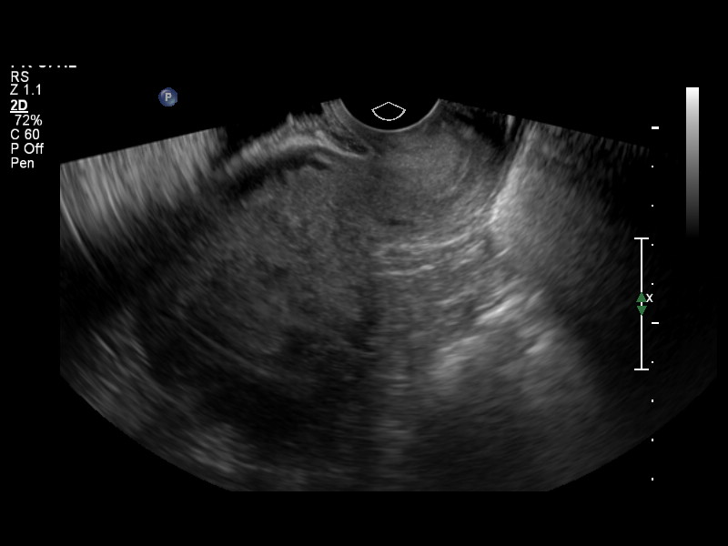
[im 45/49]
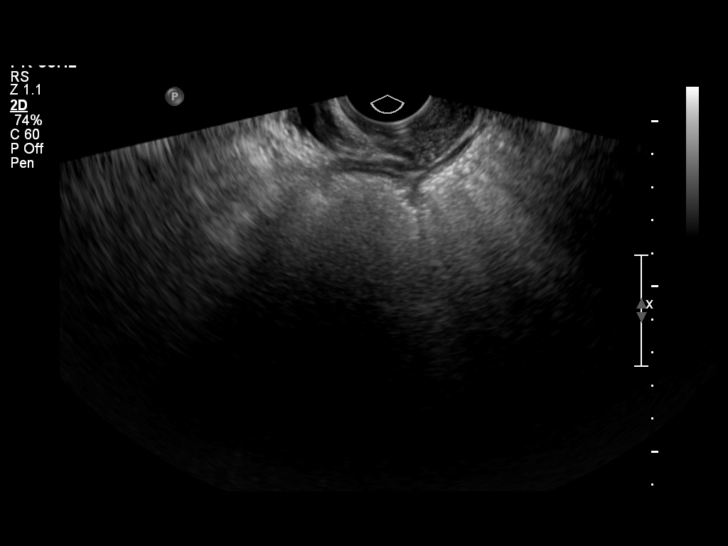
[im 49/49]
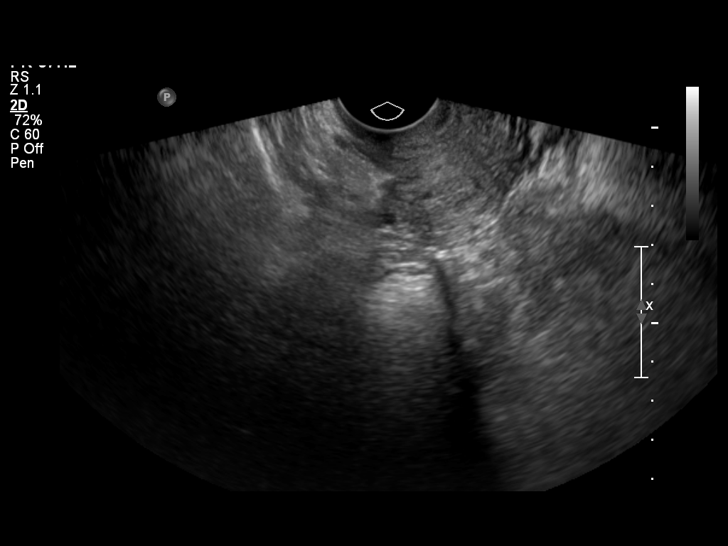

[13 of 25 positions shown; findings below may reference images not displayed]

FINDINGS: Uterus:  12.2 x 5.7 by 6.2 cm.  Uterus is retroflexed.  A
subserosal fibroid is seen in the anterior uterine body measuring
1.6 cm, and in the posterior uterine body measuring 1.3 cm.

Endometrium:  11 mm in double layer thickness transvaginally.  No
abnormality identified.

Right ovary:  3.1 x 3.0 x 3.0 cm.  Normal appearance/no adnexal
mass.

Left ovary:  3.7 x 3.8 x 4.9 cm.  Normal appearance/no adnexal
mass.  Normal follicles seen, largest measuring 2.5 cm.

Other findings:  No free fluid.
IMPRESSION: 1.  Retroflexed uterus, with two small fibroids measuring up to
cm. No submucosal fibroids identified, and endometrial thickness
within normal limits.
2.  Normal ovaries.  No evidence of adnexal mass or free fluid.

## 2013-03-17 ENCOUNTER — Ambulatory Visit (INDEPENDENT_AMBULATORY_CARE_PROVIDER_SITE_OTHER): Payer: Worker's Compensation | Admitting: Emergency Medicine

## 2013-03-17 ENCOUNTER — Encounter: Payer: Self-pay | Admitting: Emergency Medicine

## 2013-03-17 VITALS — BP 94/60 | HR 75 | Temp 98.9°F | Ht 62.0 in | Wt 115.0 lb

## 2013-03-17 DIAGNOSIS — J45909 Unspecified asthma, uncomplicated: Secondary | ICD-10-CM

## 2013-03-17 MED ORDER — MOMETASONE FURO-FORMOTEROL FUM 200-5 MCG/ACT IN AERO: 2.0000 | INHALATION_SPRAY | Freq: Two times a day (BID) | RESPIRATORY_TRACT | Status: DC

## 2013-03-17 MED ORDER — LORATADINE 10 MG PO TABS: 10.0000 mg | ORAL_TABLET | Freq: Every day | ORAL | Status: DC

## 2013-03-17 MED ORDER — ALBUTEROL SULFATE HFA 108 (90 BASE) MCG/ACT IN AERS: 2.0000 | INHALATION_SPRAY | RESPIRATORY_TRACT | Status: DC | PRN

## 2013-03-17 NOTE — Progress Notes (Signed)
Subjective:    Patient ID: [name], female    DOB: 1971-06-07, 42 y.o.   MRN: [id number]  HPI 42 yo woman, never smoker, Hx DM. 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 Started to experience burning in nose and mouth, bad taste in mouth. She experienced some SOB when exposed to the dust. This has persisted to a lesser degree even when her exposure level decreased. She has been wearing a mask for 4 months when at work. Happens at work more than at home when not exposed. Happened at first with activity, then progressed to be bothersome at rest. Sometimes burning in nose, a lot of throat clearing. Minimal cough. She has been seen at Urgent care, treated with Advair, nasal steroid, loratadine, singulair. She thinks the albuterol is helpful, but not sure about the Advair.   ROV 09/14/11 -- follow up visit for UA symptoms and possible asthma, exacerbated by exposures of dust at work. We stopped Advair last time, she wasn't really missing it. This last week she has noticed more exertional SOB. She continues to clear throat frequently. Has remained on fluticasone qd and loratadine, singulair. She had PFT today. Her cough is freq and dry, usually non-productive.   ROV 10/26/11 -- f/u allergies and asthma identified on PFT by vigorous BD response. Last time stated [facility], treated allergies aggressively. She felt that the [facility] helped w SOB, she ran out 1 week ago. Unfortunately caught URI in the interim and has needed more SABA, has had more cough.    PULMONARY FUNCTON TEST 09/14/2011  FVC 2.47  FEV1 2.08  FEV1/FVC 84.2  FVC  % Predicted 73  FEV % Predicted 79  FeF 25-75 2.71  FeF 25-75 % Predicted 3.13   ROV 12/26/11 -- f/u for Asthma and UA irritation with cough. Her cough is better since last time. Remains in environment at work to which she is sensitive. Remains on singulair, nasonex (ran out), ran out of [place] 1 week ago, feels worse off  of it. She is trying to get the meds through workman's comp, has been unable so far.   ROV 02/27/12 -- f/u for Asthma and UA irritation with cough. She has been stable, still with SABA use but rarely. She ran out of [facility] 1 week ago, has not been able to refill because worker comp has not reliably paid. She can tell that she misses it. Was treated for possible bronchitis/sinusitis 2 weeks ago, no steroids given. Stable nasal congestion. She is planning for gastric bypass sgy in the near future. She has been relying on samples for her nasal steroid, not taking right now, not taking loratadine.   ROV 06/05/12 -- f/u for Asthma and UA irritation with cough. Nasal allergies have made control difficult, as well as exposures at work.  She is now s/p gastric banding and she hasn't been at work - the absence of this exposure helped her throat and breathing. She was able to stop Dulera, decreased her usage of loratadine, nasal steroid. She is now back on all three. Her breathing has been stable. No flares since last time.   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 She has been having trouble getting her medications due to cost, currently taking none of her prior meds. She lost her job, is uninsured. She has been experiencing a lot of throat clearing, dyspnea. She is able to be active.  She has low energy  due to complications of nutritional status post-gastric banding >> using TPN at home.     Objective:  Physical Exam Filed Vitals:   03/17/13 1617  BP: 94/60  Pulse: 75  Temp: 98.9 F (37.2 C)    Gen: Pleasant, has lost significant amt of weight, in no distress,  normal affect  ENT: No lesions,  mouth clear,  oropharynx clear, no postnasal drip  Neck: No JVD, no TMG, no carotid bruits  Lungs: No use of accessory muscles, clear without rales or rhonchi  Cardiovascular: RRR, heart sounds normal, no murmur or gallops, no peripheral edema  Musculoskeletal: No  deformities, no cyanosis or clubbing  Neuro: alert, non focal  Skin: Warm, no lesions or rashes      Assessment & Plan:  Asthma She is on no meds currently due to losing her job, no insurance. I spoke with the workman' s comp nurse today to see what meds can be ordered and paid for, whether we can repeat her PFT (now that she has lost wt). Very difficult situation for her.  - certainly needs albuterol prn - unclear whether she will still need scheduled therapy, but she benefited from dulera in the past. Will restart and follow her improvement - needs to be back on allergy regimen if possible. Will order loratadine - schedule full PFT at next OV.  - rov 4-6 weeks to assess her status

## 2013-03-17 NOTE — Addendum Note (Signed)
Addended by: Leslye Peer on: 03/17/2013 05:09 PM   Modules accepted: Orders

## 2013-03-17 NOTE — Patient Instructions (Addendum)
We will try restarting Dulera 2 puffs twice a day  Use albuterol 2 puffs if needed for shortness of breath Restart loratadine 10mg  daily We will perform full pulmonary function testing at your next visit to compare with your priors.  Follow with Dr Delton Coombes in 4-6 weeks

## 2013-03-17 NOTE — Assessment & Plan Note (Signed)
She is on no meds currently due to losing her job, no insurance. I spoke with the workman' s comp nurse today to see what meds can be ordered and paid for, whether we can repeat her PFT (now that she has lost wt). Very difficult situation for her.  - certainly needs albuterol prn - unclear whether she will still need scheduled therapy, but she benefited from dulera in the past. Will restart and follow her improvement - needs to be back on allergy regimen if possible. Will order loratadine - schedule full PFT at next OV.  - rov 4-6 weeks to assess her status

## 2013-04-22 ENCOUNTER — Ambulatory Visit: Payer: Self-pay | Admitting: Emergency Medicine

## 2013-04-22 ENCOUNTER — Encounter: Payer: Self-pay | Admitting: Emergency Medicine

## 2013-05-23 ENCOUNTER — Ambulatory Visit: Payer: Self-pay | Admitting: Emergency Medicine

## 2013-05-30 ENCOUNTER — Ambulatory Visit: Payer: Self-pay | Admitting: Emergency Medicine

## 2013-06-27 ENCOUNTER — Ambulatory Visit: Payer: Self-pay | Admitting: Emergency Medicine

## 2013-07-02 ENCOUNTER — Ambulatory Visit: Payer: Self-pay | Admitting: Emergency Medicine

## 2013-07-02 ENCOUNTER — Ambulatory Visit (INDEPENDENT_AMBULATORY_CARE_PROVIDER_SITE_OTHER): Payer: Worker's Compensation | Admitting: Emergency Medicine

## 2013-07-02 ENCOUNTER — Encounter: Payer: Self-pay | Admitting: Emergency Medicine

## 2013-07-02 VITALS — BP 86/58 | HR 74 | Temp 97.9°F | Ht 62.0 in | Wt 130.2 lb

## 2013-07-02 DIAGNOSIS — J45909 Unspecified asthma, uncomplicated: Secondary | ICD-10-CM

## 2013-07-02 MED ORDER — FLUTICASONE PROPIONATE 50 MCG/ACT NA SUSP: NASAL | Status: DC

## 2013-07-02 NOTE — Assessment & Plan Note (Signed)
Her asthma was primarily related to work exposure. She no longer has his exposure and I question whether asthma is large component of her current dyspnea. She is deconditioned due to her nutritional status and this may be partially responsible. She needs full PFTs. These have not been performed for her scheduled - Continue Dulera twice a day for now - Proventil when necessary - Add nasal steroid to loratadine - follow in 3 months

## 2013-07-02 NOTE — Progress Notes (Signed)
Subjective:    Patient ID: [name], female    DOB: Mar 22, 1971, 42 y.o.   MRN: [id number]  HPI 42 yo woman, never smoker, Hx DM. 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 Started to experience burning in nose and mouth, bad taste in mouth. She experienced some SOB when exposed to the dust. This has persisted to a lesser degree even when her exposure level decreased. She has been wearing a mask for 4 months when at work. Happens at work more than at home when not exposed. Happened at first with activity, then progressed to be bothersome at rest. Sometimes burning in nose, a lot of throat clearing. Minimal cough. She has been seen at Urgent care, treated with Advair, nasal steroid, loratadine, singulair. She thinks the albuterol is helpful, but not sure about the Advair.   ROV 09/14/11 -- follow up visit for UA symptoms and possible asthma, exacerbated by exposures of dust at work. We stopped Advair last time, she wasn't really missing it. This last week she has noticed more exertional SOB. She continues to clear throat frequently. Has remained on fluticasone qd and loratadine, singulair. She had PFT today. Her cough is freq and dry, usually non-productive.   ROV 10/26/11 -- f/u allergies and asthma identified on PFT by vigorous BD response. Last time stated [facility], treated allergies aggressively. She felt that the [facility] helped w SOB, she ran out 1 week ago. Unfortunately caught URI in the interim and has needed more SABA, has had more cough.    PULMONARY FUNCTON TEST 09/14/2011  FVC 2.47  FEV1 2.08  FEV1/FVC 84.2  FVC  % Predicted 73  FEV % Predicted 79  FeF 25-75 2.71  FeF 25-75 % Predicted 3.13   ROV 12/26/11 -- f/u for Asthma and UA irritation with cough. Her cough is better since last time. Remains in environment at work to which she is sensitive. Remains on singulair, nasonex (ran out), ran out of [place] 1 week ago, feels worse off  of it. She is trying to get the meds through workman's comp, has been unable so far.   ROV 02/27/12 -- f/u for Asthma and UA irritation with cough. She has been stable, still with SABA use but rarely. She ran out of [facility] 1 week ago, has not been able to refill because worker comp has not reliably paid. She can tell that she misses it. Was treated for possible bronchitis/sinusitis 2 weeks ago, no steroids given. Stable nasal congestion. She is planning for gastric bypass sgy in the near future. She has been relying on samples for her nasal steroid, not taking right now, not taking loratadine.   ROV 06/05/12 -- f/u for Asthma and UA irritation with cough. Nasal allergies have made control difficult, as well as exposures at work.  She is now s/p gastric banding and she hasn't been at work - the absence of this exposure helped her throat and breathing. She was able to stop Dulera, decreased her usage of loratadine, nasal steroid. She is now back on all three. Her breathing has been stable. No flares since last time.   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 She has been having trouble getting her medications due to cost, currently taking none of her prior meds. She lost her job, is uninsured. She has been experiencing a lot of throat clearing, dyspnea. She is able to be active.  She has low energy  due to complications of nutritional status post-gastric banding >> using TPN at home.   ROV 07/02/13 -- asthma (mild AFL with BD response), cough, allergies. She finally got her medications in July. She is taking Dulera bid, proventil prn. She fels that the [facility] has helped. Also loratadine daily. She describes exertional SOB with almost all activity. No wheezing. Minimal cough. She is still being feed via PICC w TPN. She is eating small amounts. The surgeons are planning to reverse the gastric bypass. She feels that she needs nasal steroids also.      Objective:  Physical  Exam Filed Vitals:   07/02/13 0940  BP: 86/58  Pulse: 74  Temp: 97.9 F (36.6 C)  TempSrc: Oral  Height: 5\' 2"  (1.575 m)  Weight: 130 lb 3.2 oz (59.058 kg)  SpO2: 100%   Gen: Pleasant, has lost significant amt of weight, in no distress,  normal affect  ENT: No lesions,  mouth clear,  oropharynx clear, no postnasal drip  Neck: No JVD, no TMG, no carotid bruits  Lungs: No use of accessory muscles, clear without rales or rhonchi  Cardiovascular: RRR, heart sounds normal, no murmur or gallops, no peripheral edema  Musculoskeletal: No deformities, no cyanosis or clubbing  Neuro: alert, non focal  Skin: Warm, no lesions or rashes      Assessment & Plan:  Asthma Her asthma was primarily related to work exposure. She no longer has his exposure and I question whether asthma is large component of her current dyspnea. She is deconditioned due to her nutritional status and this may be partially responsible. She needs full PFTs. These have not been performed for her scheduled - Continue Dulera twice a day for now - Proventil when necessary - Add nasal steroid to loratadine - follow in 3 months

## 2013-07-02 NOTE — Patient Instructions (Addendum)
Please continue Dulera twice a day and Proventil as needed We will perform full pulmonary function testing Follow with Dr. Delton Coombes in 3 months to review status and testing

## 2013-08-29 ENCOUNTER — Telehealth: Payer: Self-pay | Admitting: Emergency Medicine

## 2013-08-29 DIAGNOSIS — J45909 Unspecified asthma, uncomplicated: Secondary | ICD-10-CM

## 2013-08-29 MED ORDER — FLUTICASONE PROPIONATE 50 MCG/ACT NA SUSP: NASAL | Status: DC

## 2013-08-29 NOTE — Telephone Encounter (Signed)
Rx has been sent in. Pt is aware. 

## 2013-09-01 ENCOUNTER — Telehealth: Payer: Self-pay | Admitting: Emergency Medicine

## 2013-09-01 NOTE — Telephone Encounter (Signed)
I spoke with Gus. He reports the RX for flonase came through on there end for 2 puffs twice daily. Wanting clarification if this should have been nasal spray of an inhaler. i advised him went flonase 2 sprays each nostril BID. Nothing further needed

## 2013-10-14 ENCOUNTER — Ambulatory Visit: Payer: Self-pay | Admitting: Emergency Medicine

## 2013-11-19 ENCOUNTER — Other Ambulatory Visit: Payer: Self-pay | Admitting: Emergency Medicine

## 2013-11-19 DIAGNOSIS — J45909 Unspecified asthma, uncomplicated: Secondary | ICD-10-CM

## 2013-11-20 ENCOUNTER — Ambulatory Visit: Payer: Self-pay | Admitting: Emergency Medicine

## 2013-12-24 ENCOUNTER — Ambulatory Visit: Payer: Self-pay | Admitting: Emergency Medicine

## 2013-12-24 ENCOUNTER — Telehealth: Payer: Self-pay | Admitting: Emergency Medicine

## 2013-12-24 DIAGNOSIS — J45909 Unspecified asthma, uncomplicated: Secondary | ICD-10-CM

## 2013-12-24 MED ORDER — LORATADINE 10 MG PO TABS: 10.0000 mg | ORAL_TABLET | Freq: Every day | ORAL | Status: AC

## 2013-12-24 MED ORDER — MOMETASONE FURO-FORMOTEROL FUM 200-5 MCG/ACT IN AERO: 2.0000 | INHALATION_SPRAY | Freq: Two times a day (BID) | RESPIRATORY_TRACT | Status: AC

## 2013-12-24 MED ORDER — ALBUTEROL SULFATE HFA 108 (90 BASE) MCG/ACT IN AERS: 2.0000 | INHALATION_SPRAY | RESPIRATORY_TRACT | Status: AC | PRN

## 2013-12-24 MED ORDER — DOXYCYCLINE HYCLATE 50 MG PO CAPS: 50.0000 mg | ORAL_CAPSULE | Freq: Two times a day (BID) | ORAL | Status: AC

## 2013-12-24 MED ORDER — FLUTICASONE PROPIONATE 50 MCG/ACT NA SUSP: NASAL | Status: AC

## 2013-12-24 NOTE — Telephone Encounter (Signed)
Spoke with the pt about her sx. Viral syndrome for 1.5 weeks but now with possible bronchitis. Recommended symptomatic relief with decongestants, tylenol, rest Will also treat with doxycycline x 7 days in event that this is a bronchitis.

## 2013-12-24 NOTE — Telephone Encounter (Signed)
Called, spoke with pt.  C/o sneezing, cough with a small amount of mucus but doesn't look at color, body aches, HA, nausea, chills, sweats, and temp 99.  SOB is unchanged.  Symptoms started 1 1/2 wks ago but have worsened over this wk.  Pt states she is too weak to come in for OV and is requesting rx to be called in.  She has been using Tylenol to help with symptoms.  RB, pls advise.  Thank you.    Pt also requesting rxs for dulera, flonase, claritin, and albuterol.  [facility]  [facility]   No Known Allergies

## 2013-12-25 ENCOUNTER — Ambulatory Visit: Payer: Self-pay | Admitting: Emergency Medicine

## 2014-01-27 ENCOUNTER — Ambulatory Visit: Payer: Self-pay | Admitting: Emergency Medicine
